# Patient Record
Sex: Female | Born: 1990
Health system: Southern US, Community
[De-identification: ages and names within clinical notes are randomized; demographics above are authoritative.]

## PROBLEM LIST (undated history)

## (undated) ENCOUNTER — Emergency Department (HOSPITAL_COMMUNITY): Discharge status: Absent without leave | Payer: Self-pay | Source: Home / Self Care

## (undated) ENCOUNTER — Inpatient Hospital Stay (HOSPITAL_COMMUNITY): Payer: Self-pay

## (undated) ENCOUNTER — Emergency Department (HOSPITAL_BASED_OUTPATIENT_CLINIC_OR_DEPARTMENT_OTHER): Admission: EM | Payer: Self-pay | Source: Home / Self Care

## (undated) DIAGNOSIS — Z789 Other specified health status: Secondary | ICD-10-CM

## (undated) DIAGNOSIS — R87629 Unspecified abnormal cytological findings in specimens from vagina: Secondary | ICD-10-CM

## (undated) DIAGNOSIS — K219 Gastro-esophageal reflux disease without esophagitis: Secondary | ICD-10-CM

## (undated) HISTORY — DX: Other specified health status: Z78.9

## (undated) HISTORY — PX: NO PAST SURGERIES: SHX2092

---
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
## 2011-03-19 LAB — POCT URINALYSIS DIP (DEVICE)
Glucose, UA: NEGATIVE
Ketones, ur: 15 — AB
Nitrite: NEGATIVE
Operator id: 239701
Protein, ur: 30 — AB
Specific Gravity, Urine: >=1.03
Urobilinogen, UA: 1
pH: 5.5

## 2011-03-19 LAB — POCT PREGNANCY, URINE
Operator id: 239701
Preg Test, Ur: NEGATIVE

## 2011-06-03 ENCOUNTER — Emergency Department (HOSPITAL_COMMUNITY)
Admission: EM | Admit: 2011-06-03 | Discharge: 2011-06-03 | Discharge status: Against Medical Advice | Payer: Self-pay | Attending: Emergency Medicine | Admitting: Emergency Medicine

## 2011-06-03 DIAGNOSIS — M549 Dorsalgia, unspecified: Secondary | ICD-10-CM | POA: Insufficient documentation

## 2011-06-03 NOTE — ED Notes (Signed)
Pt in from home with back pain denies recent injury states pain has been ongoing for several weeks states pain radiates to the left leg

## 2011-08-12 ENCOUNTER — Emergency Department (INDEPENDENT_AMBULATORY_CARE_PROVIDER_SITE_OTHER)
Admission: EM | Admit: 2011-08-12 | Discharge: 2011-08-12 | Disposition: A | Discharge status: Home / Self Care | Payer: Self-pay | Source: Home / Self Care | Attending: Emergency Medicine | Admitting: Emergency Medicine

## 2011-08-12 ENCOUNTER — Encounter (HOSPITAL_COMMUNITY): Payer: Self-pay

## 2011-08-12 DIAGNOSIS — M659 Synovitis and tenosynovitis, unspecified: Secondary | ICD-10-CM

## 2011-08-12 DIAGNOSIS — M549 Dorsalgia, unspecified: Secondary | ICD-10-CM

## 2011-08-12 DIAGNOSIS — N898 Other specified noninflammatory disorders of vagina: Secondary | ICD-10-CM

## 2011-08-12 LAB — WET PREP, GENITAL
Trich, Wet Prep: NONE SEEN
Yeast Wet Prep HPF POC: NONE SEEN

## 2011-08-12 LAB — POCT URINALYSIS DIP (DEVICE)
Bilirubin Urine: NEGATIVE
Glucose, UA: NEGATIVE mg/dL
Ketones, ur: 15 mg/dL — AB
Leukocytes, UA: NEGATIVE
Protein, ur: 30 mg/dL — AB
Specific Gravity, Urine: 1.02 (ref 1.005–1.030)
Urobilinogen, UA: 1 mg/dL (ref 0.0–1.0)
pH: 6.5 (ref 5.0–8.0)

## 2011-08-12 LAB — POCT PREGNANCY, URINE: Preg Test, Ur: NEGATIVE

## 2011-08-12 MED ORDER — METRONIDAZOLE 500 MG PO TABS: 500.0000 mg | ORAL_TABLET | Freq: Two times a day (BID) | ORAL | Status: AC

## 2011-08-12 MED ORDER — IBUPROFEN 800 MG PO TABS: 800.0000 mg | ORAL_TABLET | Freq: Three times a day (TID) | ORAL | Status: AC

## 2011-08-12 NOTE — ED Notes (Signed)
2 day hx of left ankle pain.  No swelling note in ankle.   1 week hx of foul odor vaginal discharge 1 week hx of back pain.   breast tenderness

## 2011-08-12 NOTE — ED Provider Notes (Signed)
History     CSN: [id number]  Arrival date & time 08/12/11  1820   First MD Initiated Contact with Patient 08/12/11 1838      No chief complaint on file.   (Consider location/radiation/quality/duration/timing/severity/associated sxs/prior treatment) HPI Comments: Patient presents urgent care tonight complaining of multiple symptoms including  Left ankle pain "I have not fallen or twisted or injured my ankle in any way" has been hurting since yesterday (points to the lateral aspect of lateral malleolus of ankle)  Patient also complains of an ongoing vaginal discharge that has an older to it it does not itch she has had bacterial vaginosis in the past and feels just like this was to be screened for sexually transmitted diseases as well.  Lower back pains that come and go have been having it for 2 days (points to lower lumbar area) no urinary symptoms no weakness no numbness no fevers.   Also described for the last 2 days left breast has been tender a bit sore no injury no trauma.  The history is provided by the patient.    No past medical history on file.  No past surgical history on file.  No family history on file.  History  Substance Use Topics  . Smoking status: Current Everyday Smoker  . Smokeless tobacco: Not on file  . Alcohol Use: Yes    OB History    Grav Para Term Preterm Abortions TAB SAB Ect Mult Living                  Review of Systems  Constitutional: Negative.  Negative for fever, chills, activity change and fatigue.  HENT: Negative for congestion.   Respiratory: Negative for shortness of breath.   Genitourinary: Positive for vaginal discharge and menstrual problem. Negative for genital sores.  Skin: Negative for rash and wound.    Allergies  Review of patient's allergies indicates no known allergies.  Home Medications  No current outpatient prescriptions on file.  BP 122/60  Pulse 60  Temp(Src) 97.7 F (36.5 C) (Oral)  Resp 12  SpO2  100%  Physical Exam  Nursing note and vitals reviewed. Constitutional: She appears well-developed and well-nourished. No distress.  HENT:  Head: Normocephalic.  Neck: Neck supple.  Abdominal: Soft. There is no tenderness.  Genitourinary: Uterus normal. Cervix exhibits discharge. Cervix exhibits no motion tenderness. Vaginal discharge found.  Musculoskeletal: Normal range of motion. She exhibits tenderness.       Right shoulder: She exhibits tenderness and pain. She exhibits normal range of motion, no swelling, no effusion, no deformity and no spasm.       Arms: Neurological: She is alert. No cranial nerve deficit. She exhibits normal muscle tone.  Skin: Skin is warm. No rash noted. No erythema.    ED Course  Procedures (including critical care time)  Labs Reviewed  POCT URINALYSIS DIP (DEVICE) - Abnormal; Notable for the following:    Ketones, ur 15 (*)    Protein, ur 30 (*)    All other components within normal limits  POCT PREGNANCY, URINE  GC/CHLAMYDIA PROBE AMP, GENITAL  WET PREP, GENITAL   No results found.   No diagnosis found.    MDM  Most symptomatic patient left ankle pain less than 24 hours nontraumatic. Vaginal discharge for one week. Lower back pains recurrent. Also concern about mild delay in her menstrual period sexually active screening for pregnancy.        Jimmie Molly, MD 08/12/11 [phone]

## 2011-08-12 NOTE — ED Notes (Signed)
Pt. Was discharged by mistake and given the wrong prescription.  Attempted to call pt. Number that was given tonight and this number was not inservice.  Called mother's telephone number listed in chart and gave mother information.  Explained to mother that pt. Would need to come back to urgent care to obtain correct prescriptions ordered by Dr. Ladon Applebaum.  Mother stated she would try to get in contact with pt.  Dr. Ladon Applebaum printed out pt. Prescriptions with discharge instructions.  Will place these documents on [place], RN phone nurse's desk.

## 2011-08-13 ENCOUNTER — Telehealth (HOSPITAL_COMMUNITY): Payer: Self-pay | Admitting: *Deleted

## 2011-08-13 LAB — GC/CHLAMYDIA PROBE AMP, GENITAL: Chlamydia, DNA Probe: NEGATIVE

## 2011-08-13 NOTE — ED Notes (Signed)
Mom contacted and asked her to tell pt. To come back and pick up her d/c instructions and 2 Rx.'s . She said she would tell her. Vassie Moselle 08/13/2011

## 2011-09-09 ENCOUNTER — Encounter (HOSPITAL_COMMUNITY): Payer: Self-pay | Admitting: Emergency Medicine

## 2011-09-09 ENCOUNTER — Emergency Department (HOSPITAL_COMMUNITY)
Admission: EM | Admit: 2011-09-09 | Discharge: 2011-09-09 | Disposition: A | Discharge status: Home / Self Care | Payer: Medicaid Other | Attending: Emergency Medicine | Admitting: Emergency Medicine

## 2011-09-09 DIAGNOSIS — N730 Acute parametritis and pelvic cellulitis: Secondary | ICD-10-CM

## 2011-09-09 DIAGNOSIS — N739 Female pelvic inflammatory disease, unspecified: Secondary | ICD-10-CM | POA: Insufficient documentation

## 2011-09-09 LAB — URINALYSIS, ROUTINE W REFLEX MICROSCOPIC
Bilirubin Urine: NEGATIVE
Glucose, UA: NEGATIVE mg/dL
Hgb urine dipstick: NEGATIVE
Ketones, ur: NEGATIVE mg/dL
Leukocytes, UA: NEGATIVE
Nitrite: NEGATIVE
Specific Gravity, Urine: 1.026 (ref 1.005–1.030)
Urobilinogen, UA: 1 mg/dL (ref 0.0–1.0)
pH: 5.5 (ref 5.0–8.0)

## 2011-09-09 LAB — WET PREP, GENITAL
Trich, Wet Prep: NONE SEEN
Yeast Wet Prep HPF POC: NONE SEEN

## 2011-09-09 LAB — POCT PREGNANCY, URINE: Preg Test, Ur: NEGATIVE

## 2011-09-09 MED ORDER — ONDANSETRON 4 MG PO TBDP
8.0000 mg | ORAL_TABLET | Freq: Once | ORAL | Status: AC
  Administered 2011-09-09: 8 mg via ORAL
  Filled 2011-09-09: qty 2

## 2011-09-09 MED ORDER — SODIUM CHLORIDE 0.9 % IV SOLN: 20.0000 mL | INTRAVENOUS | Status: DC

## 2011-09-09 MED ORDER — CEFTRIAXONE SODIUM 250 MG IJ SOLR
250.0000 mg | INTRAMUSCULAR | Status: DC
  Administered 2011-09-09: 250 mg via INTRAMUSCULAR
  Filled 2011-09-09: qty 250

## 2011-09-09 MED ORDER — AZITHROMYCIN 250 MG PO TABS
1000.0000 mg | ORAL_TABLET | Freq: Once | ORAL | Status: AC
  Administered 2011-09-09: 1000 mg via ORAL
  Filled 2011-09-09: qty 4

## 2011-09-09 MED ORDER — ACETAMINOPHEN 325 MG PO TABS
650.0000 mg | ORAL_TABLET | Freq: Once | ORAL | Status: AC
  Administered 2011-09-09: 650 mg via ORAL
  Filled 2011-09-09: qty 2

## 2011-09-09 MED ORDER — ONDANSETRON HCL 4 MG PO TABS: 4.0000 mg | ORAL_TABLET | Freq: Four times a day (QID) | ORAL | Status: AC

## 2011-09-09 MED ORDER — ASPIRIN 81 MG PO CHEW: 324.0000 mg | CHEWABLE_TABLET | Freq: Once | ORAL | Status: DC

## 2011-09-09 MED ORDER — LIDOCAINE HCL (PF) 1 % IJ SOLN
INTRAMUSCULAR | Status: AC
  Administered 2011-09-09: 5 mL
  Filled 2011-09-09: qty 5

## 2011-09-09 MED ORDER — DOXYCYCLINE HYCLATE 100 MG PO CAPS: 100.0000 mg | ORAL_CAPSULE | Freq: Two times a day (BID) | ORAL | Status: AC

## 2011-09-09 NOTE — Discharge Instructions (Signed)
Angela Lynch you are not pregnant today. You did have some discharge on exam today. You have pelvic inflammatory disease or PID We gave you to antibiotics in the ER. Continue your Flagyl at home and we will add one more antibiotic to treat this infection. Followup at the health Department at the free STD clinic next week. Do not have any intercourse until you are rechecked. If you have nausea vomiting high fever return to the ER. There is an ER at [facility] that would be more appropriate for this condition. Get a PCP from the list below. Tell your partners to go to the free STD clinic as well to be checked and treated. Take ibuprofen for pain and zofran for the nausea.   RESOURCE GUIDE  Dental Problems  Patients with Medicaid: [facility] [phone]                                           [phone] [address] Phone:  [phone]                                                  Phone:  [phone]  If unable to pay or uninsured, contact:  [facility] or [facility]. to become qualified for the adult dental clinic.  Chronic Pain Problems Contact [facility]  [phone] Patients need to be referred by their primary care doctor.  Insufficient Money for Medicine Contact [employer] Way:  call "211" or [facility] [phone].  No Primary Care Doctor Call [facility]  [phone] Other agencies that provide inexpensive medical care    [facility]  [phone]    [facility]  [phone]    [facility]  [phone]    [facility]  [phone]    [facility]  [phone]    [facility]  [phone]  Psychological Services [facility]  [phone] [facility] Services  [phone] [facility] Mental Health   [phone] (emergency services [phone])  Substance Abuse Resources [facility]  [phone] [facility] [phone] [facility] [place] [phone] Floydene Flock 5083646096 Residential & Outpatient Substance Abuse Program  [phone]  Abuse/Neglect [facility] Child Abuse Hotline 618-285-0696 [facility] Child Abuse Hotline 832-167-7883 (After Hours)  Emergency Shelter [facility] (818)[phone]  Maternity Homes Room at the [place] [facility] [place] 707-197-7985 [facility] [phone]  MRSA Hotline #:   772-008-7608    [facility] Resources  [facility] [place]     [employer]                          [facility] Dept. [address] [place]                       [address]      [address]  [address]  Cristobal Goldmann Phone:  [phone]                                   Phone:  [phone]                 Phone:  [phone]  [facility] Phone:  [phone]  [facility] Child Abuse Hotline 479-384-2662 260-768-3012 (After Hours) Pelvic Inflammatory Disease Pelvic inflammatory disease (PID) is an infection of some, or all, of the female pelvic organs. PID is caused by germs. HOME CARE  Take your medicine as told. Finish them even if you start to feel better.   Only take medicine as told by your doctor.   Do not have sex (intercourse) until the infection is gone.   Tell your sex partner you have PID. Treatment may be needed.   Keep your follow-up appointments.  GET HELP RIGHT AWAY IF:   You have a fever.   You have an increase in belly (abdominal) pain.   You start to get the chills.   You have pain when you pee (urinate).   You are not better after 72 hours.   Your sex partner tells you he or she has an STD.   You throw up (vomit).   You cannot take your medicines.  MAKE SURE YOU:   Understand these instructions.   Will watch your condition.   Will get help right  away if you are not doing well or get worse.  Document Released: 09/07/2008 Document Revised: 05/31/2011 Document Reviewed: 10/11/2009 [facility] Patient Information 2012 [place], [place].  Pelvic Inflammatory Disease Pelvic inflammatory disease (PID) is an infection of some, or all, of the female pelvic organs. PID is caused by germs. HOME CARE  Take your medicine as told. Finish them even if you start to feel better.   Only take medicine as told by your doctor.   Do not have sex (intercourse) until the infection is gone.   Tell your sex partner you have PID. Treatment may be needed.   Keep your follow-up appointments.  GET HELP RIGHT AWAY IF:   You have a fever.   You have an increase in belly (abdominal) pain.   You start to get the chills.   You have pain when you pee (urinate).   You are not better after 72 hours.   Your sex partner tells you he or she has an STD.   You throw up (vomit).   You cannot take your medicines.  MAKE SURE YOU:   Understand these instructions.   Will watch your condition.   Will get help right away if you are not doing well or get worse.  Document Released: 09/07/2008 Document Revised: 05/31/2011 Document Reviewed: 10/11/2009 [facility] Patient Information 2012 [place],  [place].

## 2011-09-09 NOTE — ED Provider Notes (Signed)
History     CSN: [id number]  Arrival date & time 09/09/11  6213   First MD Initiated Contact with Patient 09/09/11 0636      Chief Complaint  Patient presents with  . Morning Sickness  . Nausea  . Emesis    (Consider location/radiation/quality/duration/timing/severity/associated sxs/prior treatment) Patient is a 21 y.o. female presenting with vomiting. The history is provided by the patient. No language interpreter was used.  Emesis  This is a new problem. Associated symptoms include abdominal pain. Pertinent negatives include no arthralgias, no chills, no diarrhea, no fever, no headaches, no myalgias, no sweats and no URI.   patient is here today wanting to know if she is pregnant. Also having diffuse abdominal pain and vomiting as x2 this morning. States that she's had vaginal discharge 3 days. States that she went to urgent care on February 17 recieved a prescription for Flagyl for BV.  States that she didn't get the prescription filled until last week. Last menstrual period was February 14. States she has 2 partners she is not on birth control. She does not use condoms. States that she is nauseated presently. Also having some lower back pain but has not taken anything for pain.  History reviewed. No pertinent past medical history.  History reviewed. No pertinent past surgical history.  History reviewed. No pertinent family history.  History  Substance Use Topics  . Smoking status: Current Everyday Smoker  . Smokeless tobacco: Not on file  . Alcohol Use: Yes    OB History    Grav Para Term Preterm Abortions TAB SAB Ect Mult Living                  Review of Systems  Constitutional: Negative for fever and chills.  Gastrointestinal: Positive for vomiting and abdominal pain. Negative for diarrhea.  Genitourinary: Positive for vaginal discharge. Negative for dysuria, urgency, frequency, hematuria, flank pain, vaginal bleeding, difficulty urinating, menstrual problem, pelvic  pain and dyspareunia.  Musculoskeletal: Positive for back pain. Negative for myalgias, arthralgias and gait problem.  Neurological: Negative for dizziness and headaches.    Allergies  Review of patient's allergies indicates no known allergies.  Home Medications   Current Outpatient Rx  Name Route Sig Dispense Refill  . IBUPROFEN 200 MG PO TABS Oral Take 200 mg by mouth every 6 (six) hours as needed. For pain    . METRONIDAZOLE 500 MG PO TABS Oral Take 500 mg by mouth 2 (two) times daily. Start date:09-06-11      BP 127/72  Pulse 93  Temp(Src) 98.2 F (36.8 C) (Oral)  Resp 18  SpO2 97%  LMP 06/26/2011  Physical Exam  Nursing note and vitals reviewed. Constitutional: She is oriented to person, place, and time. She appears well-developed and well-nourished.  HENT:  Head: Normocephalic and atraumatic.  Eyes: Conjunctivae and EOM are normal. Pupils are equal, round, and reactive to light.  Neck: Normal range of motion. Neck supple.  Cardiovascular: Normal rate and regular rhythm.   Pulmonary/Chest: Effort normal and breath sounds normal.  Abdominal: Soft. Bowel sounds are normal.  Musculoskeletal: Normal range of motion. She exhibits no edema and no tenderness.  Neurological: She is alert and oriented to person, place, and time. She has normal reflexes.  Skin: Skin is warm and dry.  Psychiatric: She has a normal mood and affect.    ED Course  Procedures (including critical care time)   Labs Reviewed  URINALYSIS, ROUTINE W REFLEX MICROSCOPIC  POCT PREGNANCY, URINE  GC/CHLAMYDIA PROBE AMP, GENITAL  WET PREP, GENITAL   No results found.   No diagnosis found.    MDM  PID treated with rocephen and azithromycin in the ER. Negative preg test.  Continue flagyl and get rx for doxycycline filled. Follow up at std clinic.  No intercourse until rechecked and partners checked.   Labs Reviewed  WET PREP, GENITAL - Abnormal; Notable for the following:    Clue Cells Wet Prep  HPF POC FEW (*)    WBC, Wet Prep HPF POC MODERATE (*)    All other components within normal limits  URINALYSIS, ROUTINE W REFLEX MICROSCOPIC  POCT PREGNANCY, URINE  GC/CHLAMYDIA PROBE AMP, GENITAL          Jethro Bastos, NP 09/10/11 1015

## 2011-09-09 NOTE — ED Notes (Signed)
Pt reports morning sickness uncertain if preg LMP 1 month ago late 1 week

## 2011-09-09 NOTE — ED Notes (Addendum)
Patient complaining of episodes of nausea, lower right and left quadrant abdominal pain, and lower back pain for the past two to three days.  Describes abdominal pain as a "sick/nauseous" feeling; rates feeling (i.e. Pain) as 10/10 on the numerical pain scale.  Denies diarrhea, chest pain, shortness of breath, and blurred vision.  Patient does report urinary frequency; denies other urinary symptoms.  Last bowel movement today; normal.  Patient reports abnormal vaginal discharge yesterday (thick and white in consistency).  Last period 14th of February; patient states that there is a chance that she could be pregnant.  Patient alert and oriented x4; PERRL present.  Upon arrival to room, patient changed into gown and assisted to restroom to provide urine sample.  Family at bedside; will continue to monitor.

## 2011-09-09 NOTE — ED Notes (Signed)
Verified with Dr. Lynelle Doctor that orders placed were intended for another patient.

## 2011-09-10 LAB — GC/CHLAMYDIA PROBE AMP, GENITAL
Chlamydia, DNA Probe: NEGATIVE
GC Probe Amp, Genital: NEGATIVE

## 2011-09-13 NOTE — ED Provider Notes (Signed)
Medical screening examination/treatment/procedure(s) were performed by non-physician practitioner and as supervising physician I was immediately available for consultation/collaboration.  Anaiyah Anglemyer R Zacharias Ridling, MD 09/13/11 0243 

## 2011-11-01 ENCOUNTER — Emergency Department (INDEPENDENT_AMBULATORY_CARE_PROVIDER_SITE_OTHER)
Admission: EM | Admit: 2011-11-01 | Discharge: 2011-11-01 | Disposition: A | Discharge status: Home / Self Care | Source: Home / Self Care | Attending: Family Medicine | Admitting: Family Medicine

## 2011-11-01 ENCOUNTER — Encounter (HOSPITAL_COMMUNITY): Payer: Self-pay | Admitting: *Deleted

## 2011-11-01 DIAGNOSIS — J309 Allergic rhinitis, unspecified: Secondary | ICD-10-CM

## 2011-11-01 DIAGNOSIS — J302 Other seasonal allergic rhinitis: Secondary | ICD-10-CM

## 2011-11-01 LAB — POCT PREGNANCY, URINE: Preg Test, Ur: NEGATIVE

## 2011-11-01 LAB — POCT URINALYSIS DIP (DEVICE)
Ketones, ur: NEGATIVE mg/dL
Leukocytes, UA: NEGATIVE
Nitrite: NEGATIVE
Protein, ur: NEGATIVE mg/dL
Urobilinogen, UA: 1 mg/dL (ref 0.0–1.0)
pH: 6.5 (ref 5.0–8.0)

## 2011-11-01 LAB — POCT I-STAT, CHEM 8
BUN: 12 mg/dL (ref 6–23)
Calcium, Ion: 1.21 mmol/L (ref 1.12–1.32)
HCT: 42 % (ref 36.0–46.0)
Hemoglobin: 14.3 g/dL (ref 12.0–15.0)
Sodium: 140 mEq/L (ref 135–145)
TCO2: 25 mmol/L (ref 0–100)

## 2011-11-01 MED ORDER — CETIRIZINE HCL 10 MG PO TABS: 10.0000 mg | ORAL_TABLET | Freq: Every day | ORAL | Status: DC

## 2011-11-01 MED ORDER — FLUTICASONE PROPIONATE 50 MCG/ACT NA SUSP: 1.0000 | Freq: Two times a day (BID) | NASAL | Status: DC

## 2011-11-01 NOTE — ED Provider Notes (Signed)
History     CSN: [id number]  Arrival date & time 11/01/11  1641   First MD Initiated Contact with Patient 11/01/11 1653      Chief Complaint  Patient presents with  . Generalized Body Aches    (Consider location/radiation/quality/duration/timing/severity/associated sxs/prior treatment) Patient is a 21 y.o. female presenting with headaches. The history is provided by the patient and a parent.  Headache The primary symptoms include headaches and dizziness. Primary symptoms comment: never ending list of complaints , vague with years hx, nonspecific. The symptoms began more than 1 week ago.  The headache is associated with weakness.  Dizziness also occurs with weakness.  Additional symptoms include weakness.    History reviewed. No pertinent past medical history.  History reviewed. No pertinent past surgical history.  No family history on file.  History  Substance Use Topics  . Smoking status: Current Everyday Smoker  . Smokeless tobacco: Not on file  . Alcohol Use: Yes    OB History    Grav Para Term Preterm Abortions TAB SAB Ect Mult Living                  Review of Systems  Constitutional: Positive for fatigue.  HENT: Positive for congestion and rhinorrhea.   Gastrointestinal: Negative.   Musculoskeletal: Positive for back pain.  Neurological: Positive for dizziness, syncope, weakness, light-headedness and headaches.    Allergies  Review of patient's allergies indicates no known allergies.  Home Medications   Current Outpatient Rx  Name Route Sig Dispense Refill  . CETIRIZINE HCL 10 MG PO TABS Oral Take 1 tablet (10 mg total) by mouth daily. One tab daily for allergies 30 tablet 1  . FLUTICASONE PROPIONATE 50 MCG/ACT NA SUSP Nasal Place 1 spray into the nose 2 (two) times daily. 1 g 2  . IBUPROFEN 200 MG PO TABS Oral Take 200 mg by mouth every 6 (six) hours as needed. For pain    . METRONIDAZOLE 500 MG PO TABS Oral Take 500 mg by mouth 2 (two) times daily.  Start date:09-06-11      BP 139/96  Pulse 63  Temp(Src) 98.1 F (36.7 C) (Oral)  Resp 18  SpO2 96%  LMP 10/23/2011  Physical Exam  Nursing note and vitals reviewed. Constitutional: She is oriented to person, place, and time. She appears well-developed and well-nourished.  HENT:  Head: Normocephalic.  Right Ear: External ear normal.  Left Ear: External ear normal.  Nose: Nose normal.  Mouth/Throat: Oropharynx is clear and moist.  Eyes: Conjunctivae and EOM are normal. Pupils are equal, round, and reactive to light.  Neck: Normal range of motion. Neck supple. No thyromegaly present.  Cardiovascular: Normal rate, regular rhythm, normal heart sounds and intact distal pulses.   Pulmonary/Chest: Breath sounds normal.  Lymphadenopathy:    She has no cervical adenopathy.  Neurological: She is alert and oriented to person, place, and time. Coordination normal.  Skin: Skin is warm and dry.    ED Course  Procedures (including critical care time)   Labs Reviewed  POCT URINALYSIS DIP (DEVICE)  POCT I-STAT, CHEM 8  POCT PREGNANCY, URINE   No results found.   1. Seasonal allergies       MDM  All labs nl        Linna Hoff, MD 11/01/11 [phone]

## 2011-11-01 NOTE — ED Notes (Signed)
Pt  Has  Multiple   Symptoms  To  Include       Chronic  Back  Pain  For  Years    Worse  Over  Last  Several   Months        Also    Voiced  Is  Symptoms  Of     Headaches  And  r  Hip  Pain as  Well  Pt  denys  Any  specefic  Injury      She  Is  Sitting  Upright on   Exam table      Speaking in rapid  Complete  sentances    Appears  In no  Distress

## 2012-04-05 ENCOUNTER — Emergency Department (HOSPITAL_COMMUNITY)
Admission: EM | Admit: 2012-04-05 | Discharge: 2012-04-06 | Disposition: A | Discharge status: Home / Self Care | Attending: Emergency Medicine | Admitting: Emergency Medicine

## 2012-04-05 ENCOUNTER — Encounter (HOSPITAL_COMMUNITY): Payer: Self-pay | Admitting: *Deleted

## 2012-04-05 DIAGNOSIS — N390 Urinary tract infection, site not specified: Secondary | ICD-10-CM | POA: Insufficient documentation

## 2012-04-05 DIAGNOSIS — F17200 Nicotine dependence, unspecified, uncomplicated: Secondary | ICD-10-CM | POA: Insufficient documentation

## 2012-04-05 LAB — URINALYSIS, ROUTINE W REFLEX MICROSCOPIC
Bilirubin Urine: NEGATIVE
Nitrite: NEGATIVE
Specific Gravity, Urine: 1.028 (ref 1.005–1.030)
pH: 7.5 (ref 5.0–8.0)

## 2012-04-05 LAB — PREGNANCY, URINE: Preg Test, Ur: NEGATIVE

## 2012-04-05 LAB — URINE MICROSCOPIC-ADD ON

## 2012-04-05 NOTE — ED Provider Notes (Signed)
History     CSN: [id number]  Arrival date & time 04/05/12  2035   First MD Initiated Contact with Patient 04/05/12 2330      Chief Complaint  Patient presents with  . Abdominal Pain    (Consider location/radiation/quality/duration/timing/severity/associated sxs/prior treatment) HPI Comments: Patient reports urinary frequency, urgency, hematuria, and dysuria x 3 days.  LMP was at the end of August.  Has had nausea and abnormal cravings.  Denies fevers, abdominal pain, change in bowel habits, vomiting, abnormal vaginal discharge or bleeding.  Pt has not taken a home pregnancy test.   Patient is a 21 y.o. female presenting with abdominal pain. The history is provided by the patient and a parent.  Abdominal Pain The primary symptoms of the illness include nausea and dysuria. The primary symptoms of the illness do not include abdominal pain, fever, shortness of breath, vomiting, diarrhea, vaginal discharge or vaginal bleeding.  The dysuria is associated with frequency and urgency.  Additional symptoms associated with the illness include urgency and frequency. Symptoms associated with the illness do not include chills or constipation.    History reviewed. No pertinent past medical history.  History reviewed. No pertinent past surgical history.  No family history on file.  History  Substance Use Topics  . Smoking status: Current Every Day Smoker  . Smokeless tobacco: Not on file  . Alcohol Use: Yes    OB History    Grav Para Term Preterm Abortions TAB SAB Ect Mult Living                  Review of Systems  Constitutional: Negative for fever and chills.  Respiratory: Negative for shortness of breath.   Cardiovascular: Negative for chest pain.  Gastrointestinal: Positive for nausea. Negative for vomiting, abdominal pain, diarrhea, constipation and blood in stool.  Genitourinary: Positive for dysuria, urgency, frequency and menstrual problem. Negative for vaginal bleeding and  vaginal discharge.    Allergies  Review of patient's allergies indicates no known allergies.  Home Medications  No current outpatient prescriptions on file.  BP 132/57  Pulse 66  Temp 98.8 F (37.1 C) (Oral)  Resp 16  SpO2 99%  LMP 02/04/2012  Physical Exam  Nursing note and vitals reviewed. Constitutional: She appears well-developed and well-nourished. No distress.  HENT:  Head: Normocephalic and atraumatic.  Neck: Neck supple.  Cardiovascular: Normal rate and regular rhythm.   Pulmonary/Chest: Effort normal and breath sounds normal. No respiratory distress. She has no wheezes. She has no rales.  Abdominal: Soft. She exhibits no distension and no mass. There is no tenderness. There is no rebound, no guarding and no CVA tenderness.  Neurological: She is alert.  Skin: She is not diaphoretic.    ED Course  Procedures (including critical care time)  Labs Reviewed  URINALYSIS, ROUTINE W REFLEX MICROSCOPIC - Abnormal; Notable for the following:    APPearance CLOUDY (*)     Hgb urine dipstick LARGE (*)     Protein, ur 100 (*)     Leukocytes, UA LARGE (*)     All other components within normal limits  URINE MICROSCOPIC-ADD ON - Abnormal; Notable for the following:    Squamous Epithelial / LPF FEW (*)     Bacteria, UA MANY (*)     All other components within normal limits  PREGNANCY, URINE   No results found.   1. UTI (lower urinary tract infection)     MDM  Pt with three days of urinary symptoms, found to  have UTI.  LMP end of August.  Urine pregnancy is negative.  Pt advised to take home pregnancy test in another week if she still has not had a period and to follow up with gyn.  Mother states she will make an appointment for her with the gyn.  Discussed all results with patient.  Pt given return precautions.  Pt verbalizes understanding and agrees with plan.           [place], [place] 04/06/12 [phone]

## 2012-04-05 NOTE — ED Notes (Signed)
The pt thinks she has a uti and she want  To check and see if she is preg.  She has urinary frequency and blood in her urine.  lmp  aug

## 2012-04-06 MED ORDER — NITROFURANTOIN MONOHYD MACRO 100 MG PO CAPS: 100.0000 mg | ORAL_CAPSULE | Freq: Two times a day (BID) | ORAL | Status: DC

## 2012-04-06 MED ORDER — PHENAZOPYRIDINE HCL 200 MG PO TABS: 200.0000 mg | ORAL_TABLET | Freq: Three times a day (TID) | ORAL | Status: DC

## 2012-04-06 NOTE — ED Provider Notes (Signed)
Medical screening examination/treatment/procedure(s) were performed by non-physician practitioner and as supervising physician I was immediately available for consultation/collaboration.  Cahterine Heinzel M Vivika Poythress, MD 04/06/12 0542 

## 2013-01-27 ENCOUNTER — Emergency Department (HOSPITAL_BASED_OUTPATIENT_CLINIC_OR_DEPARTMENT_OTHER)
Admission: EM | Admit: 2013-01-27 | Discharge: 2013-01-27 | Disposition: A | Discharge status: Home / Self Care | Payer: Self-pay | Attending: Emergency Medicine | Admitting: Emergency Medicine

## 2013-01-27 ENCOUNTER — Encounter (HOSPITAL_BASED_OUTPATIENT_CLINIC_OR_DEPARTMENT_OTHER): Payer: Self-pay | Admitting: *Deleted

## 2013-01-27 DIAGNOSIS — Z202 Contact with and (suspected) exposure to infections with a predominantly sexual mode of transmission: Secondary | ICD-10-CM | POA: Insufficient documentation

## 2013-01-27 DIAGNOSIS — N760 Acute vaginitis: Secondary | ICD-10-CM | POA: Insufficient documentation

## 2013-01-27 DIAGNOSIS — Z3202 Encounter for pregnancy test, result negative: Secondary | ICD-10-CM | POA: Insufficient documentation

## 2013-01-27 DIAGNOSIS — Z87891 Personal history of nicotine dependence: Secondary | ICD-10-CM | POA: Insufficient documentation

## 2013-01-27 DIAGNOSIS — A749 Chlamydial infection, unspecified: Secondary | ICD-10-CM | POA: Insufficient documentation

## 2013-01-27 DIAGNOSIS — B9689 Other specified bacterial agents as the cause of diseases classified elsewhere: Secondary | ICD-10-CM | POA: Insufficient documentation

## 2013-01-27 DIAGNOSIS — A499 Bacterial infection, unspecified: Secondary | ICD-10-CM | POA: Insufficient documentation

## 2013-01-27 LAB — URINALYSIS, ROUTINE W REFLEX MICROSCOPIC
Glucose, UA: NEGATIVE mg/dL
Hgb urine dipstick: NEGATIVE
Specific Gravity, Urine: 1.029 (ref 1.005–1.030)

## 2013-01-27 LAB — WET PREP, GENITAL
Trich, Wet Prep: NONE SEEN
Yeast Wet Prep HPF POC: NONE SEEN

## 2013-01-27 LAB — PREGNANCY, URINE: Preg Test, Ur: NEGATIVE

## 2013-01-27 MED ORDER — CEFTRIAXONE SODIUM 250 MG IJ SOLR
250.0000 mg | Freq: Once | INTRAMUSCULAR | Status: AC
  Administered 2013-01-27: 250 mg via INTRAMUSCULAR
  Filled 2013-01-27: qty 250

## 2013-01-27 MED ORDER — LIDOCAINE HCL (PF) 1 % IJ SOLN
INTRAMUSCULAR | Status: AC
  Administered 2013-01-27: 5 mL
  Filled 2013-01-27: qty 5

## 2013-01-27 MED ORDER — AZITHROMYCIN 250 MG PO TABS
1000.0000 mg | ORAL_TABLET | Freq: Once | ORAL | Status: AC
  Administered 2013-01-27: 1000 mg via ORAL
  Filled 2013-01-27: qty 4

## 2013-01-27 MED ORDER — METRONIDAZOLE 500 MG PO TABS: 500.0000 mg | ORAL_TABLET | Freq: Two times a day (BID) | ORAL | Status: DC

## 2013-01-27 NOTE — ED Provider Notes (Signed)
  CSN: [id number]     Arrival date & time 01/27/13  1712 History     First MD Initiated Contact with Patient 01/27/13 1724     Chief Complaint  Patient presents with  . Vaginal Discharge   (Consider location/radiation/quality/duration/timing/severity/associated sxs/prior Treatment) HPI Comments: Pt states that her partner noticed a discharge yesterday and so they both came in to be evaluated today:pt states that she is not having any symptoms:pt states that she had chlamydia in January and was treated at that time and has not had symptoms since:pt is not using any birth control  The history is provided by the patient. No language interpreter was used.    History reviewed. No pertinent past medical history. History reviewed. No pertinent past surgical history. History reviewed. No pertinent family history. History  Substance Use Topics  . Smoking status: Former Games developer  . Smokeless tobacco: Not on file  . Alcohol Use: No   OB History   Grav Para Term Preterm Abortions TAB SAB Ect Mult Living                 Review of Systems  Constitutional: Negative.   Respiratory: Negative.   Cardiovascular: Negative.     Allergies  Review of patient's allergies indicates no known allergies.  Home Medications  No current outpatient prescriptions on file. BP 136/58  Pulse 70  Temp(Src) 98 F (36.7 C) (Oral)  Resp 16  Ht 5\' 3"  (1.6 m)  Wt 195 lb (88.451 kg)  BMI 34.55 kg/m2  SpO2 100%  LMP 12/16/2012 Physical Exam  Nursing note and vitals reviewed. Constitutional: She is oriented to person, place, and time. She appears well-developed and well-nourished.  HENT:  Head: Normocephalic and atraumatic.  Cardiovascular: Normal rate and regular rhythm.   Pulmonary/Chest: Effort normal and breath sounds normal.  Abdominal: Soft. Bowel sounds are normal. There is no tenderness.  Genitourinary:  Thick malodorous white discharge without cmt  Musculoskeletal: Normal range of motion.   Neurological: She is alert and oriented to person, place, and time.  Skin: Skin is warm and dry.    ED Course   Procedures (including critical care time)  Labs Reviewed  WET PREP, GENITAL - Abnormal; Notable for the following:    Clue Cells Wet Prep HPF POC MANY (*)    WBC, Wet Prep HPF POC MODERATE (*)    All other components within normal limits  URINALYSIS, ROUTINE W REFLEX MICROSCOPIC - Abnormal; Notable for the following:    Ketones, ur 15 (*)    All other components within normal limits  GC/CHLAMYDIA PROBE AMP  PREGNANCY, URINE   No results found. 1. Potential exposure to STD   2. BV (bacterial vaginosis)     MDM  Pt treated for std as partner has urethritis and pt having discharge:pt also treated for bv:pt is not pregnant  Teressa Lower, NP 01/27/13 1816

## 2013-01-27 NOTE — ED Notes (Signed)
Pt c/o vaginal discharge x 1 day, partner also being seen

## 2013-01-27 NOTE — ED Provider Notes (Signed)
Medical screening examination/treatment/procedure(s) were performed by non-physician practitioner and as supervising physician I was immediately available for consultation/collaboration.  Lyanne Co, MD 01/27/13 [phone]

## 2013-01-28 ENCOUNTER — Encounter (HOSPITAL_COMMUNITY): Payer: Self-pay | Admitting: *Deleted

## 2013-01-28 LAB — GC/CHLAMYDIA PROBE AMP: CT Probe RNA: NEGATIVE

## 2013-01-29 ENCOUNTER — Telehealth (HOSPITAL_COMMUNITY): Payer: Self-pay | Admitting: Emergency Medicine

## 2013-01-29 NOTE — ED Notes (Signed)
+  Gonorrhea. Patient treated with Rocephin and Zithromax. [facility] faxed. 

## 2013-01-29 NOTE — ED Notes (Signed)
Patient has +Gonorrhea. °

## 2013-02-01 ENCOUNTER — Telehealth (HOSPITAL_COMMUNITY): Payer: Self-pay | Admitting: Emergency Medicine

## 2013-04-03 ENCOUNTER — Ambulatory Visit: Payer: Self-pay | Admitting: Obstetrics & Gynecology

## 2013-06-25 NOTE — L&D Delivery Note (Signed)
Delivery Note At 10:32 AM a viable female was delivered via Vaginal, Spontaneous Delivery (Presentation: Left Occiput Posterior).  APGAR: 7, ; weight 7 lb 13.2 oz (3550 g).   Placenta status: , .  Cord: 3 vessels with the following complications: None.  Cord pH: n/a  Anesthesia: Epidural  Episiotomy: None Lacerations: None Suture Repair: n/a Est. Blood Loss (mL): 400mL  Mom to postpartum.  Baby to Couplet care / Skin to Skin.  Jacquiline Doearker, Caleb 04/20/2014, 11:15 AM  I was gloved and present for delivery in its entirety.  Prolonged second stage of labor progressed secondary to OP presentation initially.    Complications: significant labial swelling  Lacerations: none   EBL: 400mL  Ami Thornsberry ROCIO, MD 12:23 AM

## 2013-09-07 LAB — HEMOGLOBIN A1C: Hgb A1c MFr Bld: 5.7 % (ref 4.0–6.0)

## 2013-10-29 ENCOUNTER — Encounter (HOSPITAL_COMMUNITY): Payer: Self-pay | Admitting: *Deleted

## 2013-10-29 ENCOUNTER — Inpatient Hospital Stay (HOSPITAL_COMMUNITY)
Admission: AD | Admit: 2013-10-29 | Discharge: 2013-10-29 | Disposition: A | Discharge status: Home / Self Care | Payer: Medicaid Other | Source: Ambulatory Visit | Attending: Obstetrics and Gynecology | Admitting: Obstetrics and Gynecology

## 2013-10-29 DIAGNOSIS — O9989 Other specified diseases and conditions complicating pregnancy, childbirth and the puerperium: Principal | ICD-10-CM

## 2013-10-29 DIAGNOSIS — Z87891 Personal history of nicotine dependence: Secondary | ICD-10-CM | POA: Insufficient documentation

## 2013-10-29 DIAGNOSIS — O99891 Other specified diseases and conditions complicating pregnancy: Secondary | ICD-10-CM | POA: Insufficient documentation

## 2013-10-29 DIAGNOSIS — N912 Amenorrhea, unspecified: Secondary | ICD-10-CM

## 2013-10-29 LAB — URINALYSIS, ROUTINE W REFLEX MICROSCOPIC
Bilirubin Urine: NEGATIVE
Glucose, UA: NEGATIVE mg/dL
Hgb urine dipstick: NEGATIVE
Ketones, ur: NEGATIVE mg/dL
LEUKOCYTES UA: NEGATIVE
NITRITE: NEGATIVE
PROTEIN: NEGATIVE mg/dL
Specific Gravity, Urine: 1.015 (ref 1.005–1.030)
UROBILINOGEN UA: 2 mg/dL — AB (ref 0.0–1.0)
pH: 7 (ref 5.0–8.0)

## 2013-10-29 LAB — POCT PREGNANCY, URINE: PREG TEST UR: POSITIVE — AB

## 2013-10-29 NOTE — Discharge Instructions (Signed)
Pregnancy - First Trimester  During sexual intercourse, millions of sperm go into the vagina. Only 1 sperm will penetrate and fertilize the female egg while it is in the Fallopian tube. One week later, the fertilized egg implants into the wall of the uterus. An embryo begins to develop into a baby. At 6 to 8 weeks, the eyes and face are formed and the heartbeat can be seen on ultrasound. At the end of 12 weeks (first trimester), all the baby's organs are formed. Now that you are pregnant, you will want to do everything you can to have a healthy baby. Two of the most important things are to get good prenatal care and follow your caregiver's instructions. Prenatal care is all the medical care you receive before the baby's birth. It is given to prevent, find, and treat problems during the pregnancy and childbirth.  PRENATAL EXAMS  · During prenatal visits, your weight, blood pressure, and urine are checked. This is done to make sure you are healthy and progressing normally during the pregnancy.  · A pregnant woman should gain 25 to 35 pounds during the pregnancy. However, if you are overweight or underweight, your caregiver will advise you regarding your weight.  · Your caregiver will ask and answer questions for you.  · Blood work, cervical cultures, other necessary tests, and a Pap test are done during your prenatal exams. These tests are done to check on your health and the probable health of your baby. Tests are strongly recommended and done for HIV with your permission. This is the virus that causes AIDS. These tests are done because medicines can be given to help prevent your baby from being born with this infection should you have been infected without knowing it. Blood work is also used to find out your blood type, previous infections, and follow your blood levels (hemoglobin).  · Low hemoglobin (anemia) is common during pregnancy. Iron and vitamins are given to help prevent this. Later in the pregnancy, blood  tests for diabetes will be done along with any other tests if any problems develop.  · You may need other tests to make sure you and the baby are doing well.  CHANGES DURING THE FIRST TRIMESTER   Your body goes through many changes during pregnancy. They vary from person to person. Talk to your caregiver about changes you notice and are concerned about. Changes can include:  · Your menstrual period stops.  · The egg and sperm carry the genes that determine what you look like. Genes from you and your partner are forming a baby. The female genes determine whether the baby is a boy or a girl.  · Your body increases in girth and you may feel bloated.  · Feeling sick to your stomach (nauseous) and throwing up (vomiting). If the vomiting is uncontrollable, call your caregiver.  · Your breasts will begin to enlarge and become tender.  · Your nipples may stick out more and become darker.  · The need to urinate more. Painful urination may mean you have a bladder infection.  · Tiring easily.  · Loss of appetite.  · Cravings for certain kinds of food.  · At first, you may gain or lose a couple of pounds.  · You may have changes in your emotions from day to day (excited to be pregnant or concerned something may go wrong with the pregnancy and baby).  · You may have more vivid and strange dreams.  HOME CARE INSTRUCTIONS   ·   It is very important to avoid all smoking, alcohol and non-prescribed drugs during your pregnancy. These affect the formation and growth of the baby. Avoid chemicals while pregnant to ensure the delivery of a healthy infant.  · Start your prenatal visits by the 12th week of pregnancy. They are usually scheduled monthly at first, then more often in the last 2 months before delivery. Keep your caregiver's appointments. Follow your caregiver's instructions regarding medicine use, blood and lab tests, exercise, and diet.  · During pregnancy, you are providing food for you and your baby. Eat regular, well-balanced  meals. Choose foods such as meat, fish, milk and other low fat dairy products, vegetables, fruits, and whole-grain breads and cereals. Your caregiver will tell you of the ideal weight gain.  · You can help morning sickness by keeping soda crackers at the bedside. Eat a couple before arising in the morning. You may want to use the crackers without salt on them.  · Eating 4 to 5 small meals rather than 3 large meals a day also may help the nausea and vomiting.  · Drinking liquids between meals instead of during meals also seems to help nausea and vomiting.  · A physical sexual relationship may be continued throughout pregnancy if there are no other problems. Problems may be early (premature) leaking of amniotic fluid from the membranes, vaginal bleeding, or belly (abdominal) pain.  · Exercise regularly if there are no restrictions. Check with your caregiver or physical therapist if you are unsure of the safety of some of your exercises. Greater weight gain will occur in the last 2 trimesters of pregnancy. Exercising will help:  · Control your weight.  · Keep you in shape.  · Prepare you for labor and delivery.  · Help you lose your pregnancy weight after you deliver your baby.  · Wear a good support or jogging bra for breast tenderness during pregnancy. This may help if worn during sleep too.  · Ask when prenatal classes are available. Begin classes when they are offered.  · Do not use hot tubs, steam rooms, or saunas.  · Wear your seat belt when driving. This protects you and your baby if you are in an accident.  · Avoid raw meat, uncooked cheese, cat litter boxes, and soil used by cats throughout the pregnancy. These carry germs that can cause birth defects in the baby.  · The first trimester is a good time to visit your dentist for your dental health. Getting your teeth cleaned is okay. Use a softer toothbrush and brush gently during pregnancy.  · Ask for help if you have financial, counseling, or nutritional needs  during pregnancy. Your caregiver will be able to offer counseling for these needs as well as refer you for other special needs.  · Do not take any medicines or herbs unless told by your caregiver.  · Inform your caregiver if there is any mental or physical domestic violence.  · Make a list of emergency phone numbers of family, friends, hospital, and police and fire departments.  · Write down your questions. Take them to your prenatal visit.  · Do not douche.  · Do not cross your legs.  · If you have to stand for long periods of time, rotate you feet or take small steps in a circle.  · You may have more vaginal secretions that may require a sanitary pad. Do not use tampons or scented sanitary pads.  MEDICINES AND DRUG USE IN PREGNANCY  ·   Take prenatal vitamins as directed. The vitamin should contain 1 milligram of folic acid. Keep all vitamins out of reach of children. Only a couple vitamins or tablets containing iron may be fatal to a baby or young child when ingested.  · Avoid use of all medicines, including herbs, over-the-counter medicines, not prescribed or suggested by your caregiver. Only take over-the-counter or prescription medicines for pain, discomfort, or fever as directed by your caregiver. Do not use aspirin, ibuprofen, or naproxen unless directed by your caregiver.  · Let your caregiver also know about herbs you may be using.  · Alcohol is related to a number of birth defects. This includes fetal alcohol syndrome. All alcohol, in any form, should be avoided completely. Smoking will cause low birth rate and premature babies.  · Street or illegal drugs are very harmful to the baby. They are absolutely forbidden. A baby born to an addicted mother will be addicted at birth. The baby will go through the same withdrawal an adult does.  · Let your caregiver know about any medicines that you have to take and for what reason you take them.  SEEK MEDICAL CARE IF:   You have any concerns or worries during your  pregnancy. It is better to call with your questions if you feel they cannot wait, rather than worry about them.  SEEK IMMEDIATE MEDICAL CARE IF:   · An unexplained oral temperature above 102° F (38.9° C) develops, or as your caregiver suggests.  · You have leaking of fluid from the vagina (birth canal). If leaking membranes are suspected, take your temperature and inform your caregiver of this when you call.  · There is vaginal spotting or bleeding. Notify your caregiver of the amount and how many pads are used.  · You develop a bad smelling vaginal discharge with a change in the color.  · You continue to feel sick to your stomach (nauseated) and have no relief from remedies suggested. You vomit blood or coffee ground-like materials.  · You lose more than 2 pounds of weight in 1 week.  · You gain more than 2 pounds of weight in 1 week and you notice swelling of your face, hands, feet, or legs.  · You gain 5 pounds or more in 1 week (even if you do not have swelling of your hands, face, legs, or feet).  · You get exposed to German measles and have never had them.  · You are exposed to fifth disease or chickenpox.  · You develop belly (abdominal) pain. Round ligament discomfort is a common non-cancerous (benign) cause of abdominal pain in pregnancy. Your caregiver still must evaluate this.  · You develop headache, fever, diarrhea, pain with urination, or shortness of breath.  · You fall or are in a car accident or have any kind of trauma.  · There is mental or physical violence in your home.  Document Released: 06/05/2001 Document Revised: 03/05/2012 Document Reviewed: 12/07/2008  [employer]® Patient Information ©2014 [employer].

## 2013-10-29 NOTE — MAU Note (Signed)
PT SAYS  HER LMP-  WAS DEC- 2014      NO BIRTH CONTROL.   LAST SEX-   5-5.     DID HPT-   MON- POSITIVE,    SAYS NO PROBLEMS.

## 2013-10-29 NOTE — MAU Note (Signed)
Pt denies any pain or complications.  Desires pregnancy verification letter.  Doppler [id number].

## 2013-10-29 NOTE — MAU Provider Note (Signed)
History     CSN: [id number]  Arrival date and time: 10/29/13 1723   First Provider Initiated Contact with Patient 10/29/13 1947      No chief complaint on file.  HPI This is a 23 y.o.  female at 265w1d by LMP who presents with request for pregnancy verification letter. Denies pain or bleeding.   RN Note: PT SAYS HER LMP- WAS DEC- 2014 NO BIRTH CONTROL. LAST SEX- 5-5. DID HPT- MON- POSITIVE, SAYS NO PROBLEMS.       Pt denies any pain or complications. Desires pregnancy verification letter. Doppler [id number].        OB History   Grav Para Term Preterm Abortions TAB SAB Ect Mult Living   1               No past medical history on file.  No past surgical history on file.  No family history on file.  History  Substance Use Topics  . Smoking status: Former Games developermoker  . Smokeless tobacco: Not on file  . Alcohol Use: No    Allergies: No Known Allergies  No prescriptions prior to admission    Review of Systems  Constitutional: Negative for fever, chills and malaise/fatigue.  Gastrointestinal: Negative for nausea, vomiting and abdominal pain.  Neurological: Negative for dizziness.   Physical Exam   Blood pressure 111/48, pulse 80, temperature 98.3 F (36.8 C), temperature source Oral, resp. rate 20, height 5\' 2"  (1.575 m), weight 81.874 kg (180 lb 8 oz), last menstrual period 06/12/2013.  Physical Exam  Constitutional: She is oriented to person, place, and time. She appears well-developed and well-nourished. No distress.  Cardiovascular: Normal rate.   Respiratory: Effort normal.  GI: Soft. There is no tenderness. There is no rebound and no guarding.  Musculoskeletal: Normal range of motion.  Neurological: She is alert and oriented to person, place, and time.  Skin: Skin is warm and dry.  Psychiatric: She has a normal mood and affect.   Doppler FHR 152 Fundal height about 12-16 weeks  MAU Course  Procedures  MDM Results for orders placed during the hospital  encounter of 10/29/13 (from the past 72 hour(s))  URINALYSIS, ROUTINE W REFLEX MICROSCOPIC     Status: Abnormal   Collection Time    10/29/13  5:45 PM      Result Value Ref Range   Color, Urine YELLOW  YELLOW   APPearance CLEAR  CLEAR   Specific Gravity, Urine 1.015  1.005 - 1.030   pH 7.0  5.0 - 8.0   Glucose, UA NEGATIVE  NEGATIVE mg/dL   Hgb urine dipstick NEGATIVE  NEGATIVE   Bilirubin Urine NEGATIVE  NEGATIVE   Ketones, ur NEGATIVE  NEGATIVE mg/dL   Protein, ur NEGATIVE  NEGATIVE mg/dL   Urobilinogen, UA 2.0 (*) 0.0 - 1.0 mg/dL   Nitrite NEGATIVE  NEGATIVE   Leukocytes, UA NEGATIVE  NEGATIVE   Comment: MICROSCOPIC NOT DONE ON URINES WITH NEGATIVE PROTEIN, BLOOD, LEUKOCYTES, NITRITE, OR GLUCOSE <1000 mg/dL.  POCT PREGNANCY, URINE     Status: Abnormal   Collection Time    10/29/13  5:56 PM      Result Value Ref Range   Preg Test, Ur POSITIVE (*) NEGATIVE   Comment:            THE SENSITIVITY OF THIS     METHODOLOGY IS >24 mIU/mL     Assessment and Plan  A:  SIUP at 2565w1d by LMP but 12-16 week by fundal height  No complaints  P:  Discharge home       Pregnancy verification letter given       Referred to Health Dept  Aviva SignsMarie L Williams 10/29/2013, 8:03 PM

## 2013-11-02 NOTE — MAU Provider Note (Signed)
Attestation of Attending Supervision of Advanced Practitioner (CNM/NP): Evaluation and management procedures were performed by the Advanced Practitioner under my supervision and collaboration.  I have reviewed the Advanced Practitioner's note and chart, and I agree with the management and plan.  Shakila Mak 11/02/2013 7:36 AM

## 2013-11-05 ENCOUNTER — Encounter: Payer: Self-pay | Admitting: Advanced Practice Midwife

## 2013-11-05 ENCOUNTER — Other Ambulatory Visit (HOSPITAL_COMMUNITY): Payer: Self-pay | Admitting: Advanced Practice Midwife

## 2013-11-05 ENCOUNTER — Ambulatory Visit (HOSPITAL_COMMUNITY)
Admission: RE | Admit: 2013-11-05 | Discharge: 2013-11-05 | Disposition: A | Discharge status: Home / Self Care | Source: Ambulatory Visit | Attending: Advanced Practice Midwife | Admitting: Advanced Practice Midwife

## 2013-11-05 DIAGNOSIS — N912 Amenorrhea, unspecified: Secondary | ICD-10-CM

## 2013-11-05 DIAGNOSIS — Z3689 Encounter for other specified antenatal screening: Secondary | ICD-10-CM | POA: Insufficient documentation

## 2013-11-05 DIAGNOSIS — O26849 Uterine size-date discrepancy, unspecified trimester: Secondary | ICD-10-CM | POA: Insufficient documentation

## 2013-11-30 ENCOUNTER — Encounter: Payer: Self-pay | Admitting: *Deleted

## 2013-12-10 DIAGNOSIS — E669 Obesity, unspecified: Secondary | ICD-10-CM | POA: Insufficient documentation

## 2013-12-10 DIAGNOSIS — E785 Hyperlipidemia, unspecified: Secondary | ICD-10-CM | POA: Insufficient documentation

## 2013-12-10 DIAGNOSIS — K219 Gastro-esophageal reflux disease without esophagitis: Secondary | ICD-10-CM | POA: Insufficient documentation

## 2013-12-21 ENCOUNTER — Ambulatory Visit (INDEPENDENT_AMBULATORY_CARE_PROVIDER_SITE_OTHER): Payer: Medicaid Other | Admitting: Obstetrics and Gynecology

## 2013-12-21 ENCOUNTER — Encounter: Payer: Self-pay | Admitting: Obstetrics and Gynecology

## 2013-12-21 VITALS — BP 114/64 | HR 88 | Temp 98.1°F | Wt 184.7 lb

## 2013-12-21 DIAGNOSIS — O0932 Supervision of pregnancy with insufficient antenatal care, second trimester: Secondary | ICD-10-CM

## 2013-12-21 DIAGNOSIS — Z3402 Encounter for supervision of normal first pregnancy, second trimester: Secondary | ICD-10-CM

## 2013-12-21 DIAGNOSIS — O0930 Supervision of pregnancy with insufficient antenatal care, unspecified trimester: Secondary | ICD-10-CM | POA: Insufficient documentation

## 2013-12-21 DIAGNOSIS — E669 Obesity, unspecified: Secondary | ICD-10-CM

## 2013-12-21 DIAGNOSIS — Z3400 Encounter for supervision of normal first pregnancy, unspecified trimester: Secondary | ICD-10-CM | POA: Insufficient documentation

## 2013-12-21 LAB — POCT URINALYSIS DIP (DEVICE)
Bilirubin Urine: NEGATIVE
Glucose, UA: NEGATIVE mg/dL
Hgb urine dipstick: NEGATIVE
KETONES UR: NEGATIVE mg/dL
NITRITE: NEGATIVE
PH: 6.5 (ref 5.0–8.0)
PROTEIN: NEGATIVE mg/dL
Specific Gravity, Urine: 1.025 (ref 1.005–1.030)
UROBILINOGEN UA: 1 mg/dL (ref 0.0–1.0)

## 2013-12-21 MED ORDER — PRENATAL VITAMINS 0.8 MG PO TABS: 1.0000 | ORAL_TABLET | Freq: Every day | ORAL | Status: DC

## 2013-12-21 NOTE — Addendum Note (Signed)
Addended by: Gerome ApleyZEYFANG, LINDA L on: 12/21/2013 10:51 AM   Modules accepted: Orders

## 2013-12-21 NOTE — Progress Notes (Signed)
New OB educational material given.

## 2013-12-21 NOTE — Addendum Note (Signed)
Addended by: Catalina AntiguaONSTANT, PEGGY on: 12/21/2013 10:20 AM   Modules accepted: Orders

## 2013-12-21 NOTE — Progress Notes (Deleted)
Nutrition note: 1st visit consult Pt has h/o obesity. Pt has lost 7.3# @ 23w, but pt has gained wt since beginning of May. Pt reports she changed a few eating habits, which could explain wt loss. Pt reports eating 3-4 meals & 0-1 snack/d. Pt has been taking a PNV. Pt reports no N/V or heartburn. NKFA. Pt is walking most days. Pt received verbal & written education on general nutrition during pregnancy. Encouraged protein with all meals & snacks. Discussed wt gain goals of 11-20# or 0.5#/wk. Pt agrees to continue taking a PNV & eat energy dense snacks. Pt has WIC & plans to BF. F/u as needed Blondell RevealLaura Smith, MS, RD, LDN, [facility]

## 2013-12-21 NOTE — Progress Notes (Signed)
U/S scheduled 12/22/13 at 730 am.

## 2013-12-21 NOTE — Progress Notes (Signed)
Nutrition note: 1st visit consult  Pt has h/o obesity.  Pt has lost 7.3# @ 23w, but pt has gained 4.2# since beginning of May. Pt reports she changed a few eating habits, which could explain wt loss.  Pt reports eating 3-4 meals & 0-1 snack/d.  Pt has been taking a PNV.  Pt reports no N/V or heartburn. NKFA.  Pt is walking most days.  Pt received verbal & written education on general nutrition during pregnancy. Encouraged protein with all meals & snacks.  Discussed wt gain goals of 11-20# or 0.5#/wk.  Pt agrees to continue taking a PNV & eat energy dense snacks.  Pt has WIC & plans to BF.  F/u as needed  Blondell RevealLaura Smith, MS, RD, LDN, [facility]CLC

## 2013-12-21 NOTE — Progress Notes (Signed)
Pt needs prescription for prenatal vitamins, and wants to know if she can take over the counter medication for BV.  Pt does not feel the baby.

## 2013-12-21 NOTE — Patient Instructions (Signed)
Second Trimester of Pregnancy The second trimester is from week 13 through week 28, months 4 through 6. The second trimester is often a time when you feel your best. Your body has also adjusted to being pregnant, and you begin to feel better physically. Usually, morning sickness has lessened or quit completely, you may have more energy, and you may have an increase in appetite. The second trimester is also a time when the fetus is growing rapidly. At the end of the sixth month, the fetus is about 9 inches long and weighs about 1 pounds. You will likely begin to feel the baby move (quickening) between 18 and 20 weeks of the pregnancy. BODY CHANGES Your body goes through many changes during pregnancy. The changes vary from woman to woman.   Your weight will continue to increase. You will notice your lower abdomen bulging out.  You may begin to get stretch marks on your hips, abdomen, and breasts.  You may develop headaches that can be relieved by medicines approved by your health care provider.  You may urinate more often because the fetus is pressing on your bladder.  You may develop or continue to have heartburn as a result of your pregnancy.  You may develop constipation because certain hormones are causing the muscles that push waste through your intestines to slow down.  You may develop hemorrhoids or swollen, bulging veins (varicose veins).  You may have back pain because of the weight gain and pregnancy hormones relaxing your joints between the bones in your pelvis and as a result of a shift in weight and the muscles that support your balance.  Your breasts will continue to grow and be tender.  Your gums may bleed and may be sensitive to brushing and flossing.  Dark spots or blotches (chloasma, mask of pregnancy) may develop on your face. This will likely fade after the baby is born.  A dark line from your belly button to the pubic area (linea nigra) may appear. This will likely  fade after the baby is born.  You may have changes in your hair. These can include thickening of your hair, rapid growth, and changes in texture. Some women also have hair loss during or after pregnancy, or hair that feels dry or thin. Your hair will most likely return to normal after your baby is born. WHAT TO EXPECT AT YOUR PRENATAL VISITS During a routine prenatal visit:  You will be weighed to make sure you and the fetus are growing normally.  Your blood pressure will be taken.  Your abdomen will be measured to track your baby's growth.  The fetal heartbeat will be listened to.  Any test results from the previous visit will be discussed. Your health care provider may ask you:  How you are feeling.  If you are feeling the baby move.  If you have had any abnormal symptoms, such as leaking fluid, bleeding, severe headaches, or abdominal cramping.  If you have any questions. Other tests that may be performed during your second trimester include:  Blood tests that check for:  Low iron levels (anemia).  Gestational diabetes (between 24 and 28 weeks).  Rh antibodies.  Urine tests to check for infections, diabetes, or protein in the urine.  An ultrasound to confirm the proper growth and development of the baby.  An amniocentesis to check for possible genetic problems.  Fetal screens for spina bifida and Down syndrome. HOME CARE INSTRUCTIONS   Avoid all smoking, herbs, alcohol, and unprescribed   drugs. These chemicals affect the formation and growth of the baby.  Follow your health care provider's instructions regarding medicine use. There are medicines that are either safe or unsafe to take during pregnancy.  Exercise only as directed by your health care provider. Experiencing uterine cramps is a good sign to stop exercising.  Continue to eat regular, healthy meals.  Wear a good support bra for breast tenderness.  Do not use hot tubs, steam rooms, or saunas.  Wear  your seat belt at all times when driving.  Avoid raw meat, uncooked cheese, cat litter boxes, and soil used by cats. These carry germs that can cause birth defects in the baby.  Take your prenatal vitamins.  Try taking a stool softener (if your health care provider approves) if you develop constipation. Eat more high-fiber foods, such as fresh vegetables or fruit and whole grains. Drink plenty of fluids to keep your urine clear or pale yellow.  Take warm sitz baths to soothe any pain or discomfort caused by hemorrhoids. Use hemorrhoid cream if your health care provider approves.  If you develop varicose veins, wear support hose. Elevate your feet for 15 minutes, 3-4 times a day. Limit salt in your diet.  Avoid heavy lifting, wear low heel shoes, and practice good posture.  Rest with your legs elevated if you have leg cramps or low back pain.  Visit your dentist if you have not gone yet during your pregnancy. Use a soft toothbrush to brush your teeth and be gentle when you floss.  A sexual relationship may be continued unless your health care provider directs you otherwise.  Continue to go to all your prenatal visits as directed by your health care provider. SEEK MEDICAL CARE IF:   You have dizziness.  You have mild pelvic cramps, pelvic pressure, or nagging pain in the abdominal area.  You have persistent nausea, vomiting, or diarrhea.  You have a bad smelling vaginal discharge.  You have pain with urination. SEEK IMMEDIATE MEDICAL CARE IF:   You have a fever.  You are leaking fluid from your vagina.  You have spotting or bleeding from your vagina.  You have severe abdominal cramping or pain.  You have rapid weight gain or loss.  You have shortness of breath with chest pain.  You notice sudden or extreme swelling of your face, hands, ankles, feet, or legs.  You have not felt your baby move in over an hour.  You have severe headaches that do not go away with  medicine.  You have vision changes. Document Released: 06/05/2001 Document Revised: 06/16/2013 Document Reviewed: 08/12/2012 [employer] Patient Information 2015 [employer]. This information is not intended to replace advice given to you by your health care provider. Make sure you discuss any questions you have with your health care provider.  Contraception Choices Contraception (birth control) is the use of any methods or devices to prevent pregnancy. Below are some methods to help avoid pregnancy. HORMONAL METHODS   Contraceptive implant. This is a thin, plastic tube containing progesterone hormone. It does not contain estrogen hormone. Your health care provider inserts the tube in the inner part of the upper arm. The tube can remain in place for up to 3 years. After 3 years, the implant must be removed. The implant prevents the ovaries from releasing an egg (ovulation), thickens the cervical mucus to prevent sperm from entering the uterus, and thins the lining of the inside of the uterus.  Progesterone-only injections. These injections are given   every 3 months by your health care provider to prevent pregnancy. This synthetic progesterone hormone stops the ovaries from releasing eggs. It also thickens cervical mucus and changes the uterine lining. This makes it harder for sperm to survive in the uterus.  Birth control pills. These pills contain estrogen and progesterone hormone. They work by preventing the ovaries from releasing eggs (ovulation). They also cause the cervical mucus to thicken, preventing the sperm from entering the uterus. Birth control pills are prescribed by a health care provider.Birth control pills can also be used to treat heavy periods.  Minipill. This type of birth control pill contains only the progesterone hormone. They are taken every day of each month and must be prescribed by your health care provider.  Birth control patch. The patch contains hormones similar to  those in birth control pills. It must be changed once a week and is prescribed by a health care provider.  Vaginal ring. The ring contains hormones similar to those in birth control pills. It is left in the vagina for 3 weeks, removed for 1 week, and then a new one is put back in place. The patient must be comfortable inserting and removing the ring from the vagina.A health care provider's prescription is necessary.  Emergency contraception. Emergency contraceptives prevent pregnancy after unprotected sexual intercourse. This pill can be taken right after sex or up to 5 days after unprotected sex. It is most effective the sooner you take the pills after having sexual intercourse. Most emergency contraceptive pills are available without a prescription. Check with your pharmacist. Do not use emergency contraception as your only form of birth control. BARRIER METHODS   Female condom. This is a thin sheath (latex or rubber) that is worn over the penis during sexual intercourse. It can be used with spermicide to increase effectiveness.  Female condom. This is a soft, loose-fitting sheath that is put into the vagina before sexual intercourse.  Diaphragm. This is a soft, latex, dome-shaped barrier that must be fitted by a health care provider. It is inserted into the vagina, along with a spermicidal jelly. It is inserted before intercourse. The diaphragm should be left in the vagina for 6 to 8 hours after intercourse.  Cervical cap. This is a round, soft, latex or plastic cup that fits over the cervix and must be fitted by a health care provider. The cap can be left in place for up to 48 hours after intercourse.  Sponge. This is a soft, circular piece of polyurethane foam. The sponge has spermicide in it. It is inserted into the vagina after wetting it and before sexual intercourse.  Spermicides. These are chemicals that kill or block sperm from entering the cervix and uterus. They come in the form of  creams, jellies, suppositories, foam, or tablets. They do not require a prescription. They are inserted into the vagina with an applicator before having sexual intercourse. The process must be repeated every time you have sexual intercourse. INTRAUTERINE CONTRACEPTION  Intrauterine device (IUD). This is a T-shaped device that is put in a woman's uterus during a menstrual period to prevent pregnancy. There are 2 types:  Copper IUD. This type of IUD is wrapped in copper wire and is placed inside the uterus. Copper makes the uterus and fallopian tubes produce a fluid that kills sperm. It can stay in place for 10 years.  Hormone IUD. This type of IUD contains the hormone progestin (synthetic progesterone). The hormone thickens the cervical mucus and prevents sperm from   entering the uterus, and it also thins the uterine lining to prevent implantation of a fertilized egg. The hormone can weaken or kill the sperm that get into the uterus. It can stay in place for 3-5 years, depending on which type of IUD is used. PERMANENT METHODS OF CONTRACEPTION  Female tubal ligation. This is when the woman's fallopian tubes are surgically sealed, tied, or blocked to prevent the egg from traveling to the uterus.  Hysteroscopic sterilization. This involves placing a small coil or insert into each fallopian tube. Your doctor uses a technique called hysteroscopy to do the procedure. The device causes scar tissue to form. This results in permanent blockage of the fallopian tubes, so the sperm cannot fertilize the egg. It takes about 3 months after the procedure for the tubes to become blocked. You must use another form of birth control for these 3 months.  Female sterilization. This is when the female has the tubes that carry sperm tied off (vasectomy).This blocks sperm from entering the vagina during sexual intercourse. After the procedure, the man can still ejaculate fluid (semen). NATURAL PLANNING METHODS  Natural family  planning. This is not having sexual intercourse or using a barrier method (condom, diaphragm, cervical cap) on days the woman could become pregnant.  Calendar method. This is keeping track of the length of each menstrual cycle and identifying when you are fertile.  Ovulation method. This is avoiding sexual intercourse during ovulation.  Symptothermal method. This is avoiding sexual intercourse during ovulation, using a thermometer and ovulation symptoms.  Post-ovulation method. This is timing sexual intercourse after you have ovulated. Regardless of which type or method of contraception you choose, it is important that you use condoms to protect against the transmission of sexually transmitted infections (STIs). Talk with your health care provider about which form of contraception is most appropriate for you. Document Released: 06/11/2005 Document Revised: 06/16/2013 Document Reviewed: 12/04/2012 [employer] Patient Information 2015 [employer]. This information is not intended to replace advice given to you by your health care provider. Make sure you discuss any questions you have with your health care provider.  Breastfeeding Deciding to breastfeed is one of the best choices you can make for you and your baby. A change in hormones during pregnancy causes your breast tissue to grow and increases the number and size of your milk ducts. These hormones also allow proteins, sugars, and fats from your blood supply to make breast milk in your milk-producing glands. Hormones prevent breast milk from being released before your baby is born as well as prompt milk flow after birth. Once breastfeeding has begun, thoughts of your baby, as well as his or her sucking or crying, can stimulate the release of milk from your milk-producing glands.  BENEFITS OF BREASTFEEDING For Your Baby  Your first milk (colostrum) helps your baby's digestive system function better.   There are antibodies in your milk that  help your baby fight off infections.   Your baby has a lower incidence of asthma, allergies, and sudden infant death syndrome.   The nutrients in breast milk are better for your baby than infant formulas and are designed uniquely for your baby's needs.   Breast milk improves your baby's brain development.   Your baby is less likely to develop other conditions, such as childhood obesity, asthma, or type 2 diabetes mellitus.  For You   Breastfeeding helps to create a very special bond between you and your baby.   Breastfeeding is convenient. Breast milk is   always available at the correct temperature and costs nothing.   Breastfeeding helps to burn calories and helps you lose the weight gained during pregnancy.   Breastfeeding makes your uterus contract to its prepregnancy size faster and slows bleeding (lochia) after you give birth.   Breastfeeding helps to lower your risk of developing type 2 diabetes mellitus, osteoporosis, and breast or ovarian cancer later in life. SIGNS THAT YOUR BABY IS HUNGRY Early Signs of Hunger  Increased alertness or activity.  Stretching.  Movement of the head from side to side.  Movement of the head and opening of the mouth when the corner of the mouth or cheek is stroked (rooting).  Increased sucking sounds, smacking lips, cooing, sighing, or squeaking.  Hand-to-mouth movements.  Increased sucking of fingers or hands. Late Signs of Hunger  Fussing.  Intermittent crying. Extreme Signs of Hunger Signs of extreme hunger will require calming and consoling before your baby will be able to breastfeed successfully. Do not wait for the following signs of extreme hunger to occur before you initiate breastfeeding:   Restlessness.  A loud, strong cry.   Screaming. BREASTFEEDING BASICS Breastfeeding Initiation  Find a comfortable place to sit or lie down, with your neck and back well supported.  Place a pillow or rolled up blanket  under your baby to bring him or her to the level of your breast (if you are seated). Nursing pillows are specially designed to help support your arms and your baby while you breastfeed.  Make sure that your baby's abdomen is facing your abdomen.   Gently massage your breast. With your fingertips, massage from your chest wall toward your nipple in a circular motion. This encourages milk flow. You may need to continue this action during the feeding if your milk flows slowly.  Support your breast with 4 fingers underneath and your thumb above your nipple. Make sure your fingers are well away from your nipple and your baby's mouth.   Stroke your baby's lips gently with your finger or nipple.   When your baby's mouth is open wide enough, quickly bring your baby to your breast, placing your entire nipple and as much of the colored area around your nipple (areola) as possible into your baby's mouth.   More areola should be visible above your baby's upper lip than below the lower lip.   Your baby's tongue should be between his or her lower gum and your breast.   Ensure that your baby's mouth is correctly positioned around your nipple (latched). Your baby's lips should create a seal on your breast and be turned out (everted).  It is common for your baby to suck about 2-3 minutes in order to start the flow of breast milk. Latching Teaching your baby how to latch on to your breast properly is very important. An improper latch can cause nipple pain and decreased milk supply for you and poor weight gain in your baby. Also, if your baby is not latched onto your nipple properly, he or she may swallow some air during feeding. This can make your baby fussy. Burping your baby when you switch breasts during the feeding can help to get rid of the air. However, teaching your baby to latch on properly is still the best way to prevent fussiness from swallowing air while breastfeeding. Signs that your baby has  successfully latched on to your nipple:    Silent tugging or silent sucking, without causing you pain.   Swallowing heard between every 3-4   sucks.    Muscle movement above and in front of his or her ears while sucking.  Signs that your baby has not successfully latched on to nipple:   Sucking sounds or smacking sounds from your baby while breastfeeding.  Nipple pain. If you think your baby has not latched on correctly, slip your finger into the corner of your baby's mouth to break the suction and place it between your baby's gums. Attempt breastfeeding initiation again. Signs of Successful Breastfeeding Signs from your baby:   A gradual decrease in the number of sucks or complete cessation of sucking.   Falling asleep.   Relaxation of his or her body.   Retention of a small amount of milk in his or her mouth.   Letting go of your breast by himself or herself. Signs from you:  Breasts that have increased in firmness, weight, and size 1-3 hours after feeding.   Breasts that are softer immediately after breastfeeding.  Increased milk volume, as well as a change in milk consistency and color by the fifth day of breastfeeding.   Nipples that are not sore, cracked, or bleeding. Signs That Your Baby is Getting Enough Milk  Wetting at least 3 diapers in a 24-hour period. The urine should be clear and pale yellow by age 5 days.  At least 3 stools in a 24-hour period by age 5 days. The stool should be soft and yellow.  At least 3 stools in a 24-hour period by age 7 days. The stool should be seedy and yellow.  No loss of weight greater than 10% of birth weight during the first 3 days of age.  Average weight gain of 4-7 ounces (113-198 g) per week after age 4 days.  Consistent daily weight gain by age 5 days, without weight loss after the age of 2 weeks. After a feeding, your baby may spit up a small amount. This is common. BREASTFEEDING FREQUENCY AND DURATION Frequent  feeding will help you make more milk and can prevent sore nipples and breast engorgement. Breastfeed when you feel the need to reduce the fullness of your breasts or when your baby shows signs of hunger. This is called "breastfeeding on demand." Avoid introducing a pacifier to your baby while you are working to establish breastfeeding (the first 4-6 weeks after your baby is born). After this time you may choose to use a pacifier. Research has shown that pacifier use during the first year of a baby's life decreases the risk of sudden infant death syndrome (SIDS). Allow your baby to feed on each breast as long as he or she wants. Breastfeed until your baby is finished feeding. When your baby unlatches or falls asleep while feeding from the first breast, offer the second breast. Because newborns are often sleepy in the first few weeks of life, you may need to awaken your baby to get him or her to feed. Breastfeeding times will vary from baby to baby. However, the following rules can serve as a guide to help you ensure that your baby is properly fed:  Newborns (babies 4 weeks of age or younger) may breastfeed every 1-3 hours.  Newborns should not go longer than 3 hours during the day or 5 hours during the night without breastfeeding.  You should breastfeed your baby a minimum of 8 times in a 24-hour period until you begin to introduce solid foods to your baby at around 6 months of age. BREAST MILK PUMPING Pumping and storing breast milk allows   you to ensure that your baby is exclusively fed your breast milk, even at times when you are unable to breastfeed. This is especially important if you are going back to work while you are still breastfeeding or when you are not able to be present during feedings. Your lactation consultant can give you guidelines on how long it is safe to store breast milk.  A breast pump is a machine that allows you to pump milk from your breast into a sterile bottle. The pumped breast  milk can then be stored in a refrigerator or freezer. Some breast pumps are operated by hand, while others use electricity. Ask your lactation consultant which type will work best for you. Breast pumps can be purchased, but some hospitals and breastfeeding support groups lease breast pumps on a monthly basis. A lactation consultant can teach you how to hand express breast milk, if you prefer not to use a pump.  CARING FOR YOUR BREASTS WHILE YOU BREASTFEED Nipples can become dry, cracked, and sore while breastfeeding. The following recommendations can help keep your breasts moisturized and healthy:  Avoid using soap on your nipples.   Wear a supportive bra. Although not required, special nursing bras and tank tops are designed to allow access to your breasts for breastfeeding without taking off your entire bra or top. Avoid wearing underwire-style bras or extremely tight bras.  Air dry your nipples for 3-4minutes after each feeding.   Use only cotton bra pads to absorb leaked breast milk. Leaking of breast milk between feedings is normal.   Use lanolin on your nipples after breastfeeding. Lanolin helps to maintain your skin's normal moisture barrier. If you use pure lanolin, you do not need to wash it off before feeding your baby again. Pure lanolin is not toxic to your baby. You may also hand express a few drops of breast milk and gently massage that milk into your nipples and allow the milk to air dry. In the first few weeks after giving birth, some women experience extremely full breasts (engorgement). Engorgement can make your breasts feel heavy, warm, and tender to the touch. Engorgement peaks within 3-5 days after you give birth. The following recommendations can help ease engorgement:  Completely empty your breasts while breastfeeding or pumping. You may want to start by applying warm, moist heat (in the shower or with warm water-soaked hand towels) just before feeding or pumping. This  increases circulation and helps the milk flow. If your baby does not completely empty your breasts while breastfeeding, pump any extra milk after he or she is finished.  Wear a snug bra (nursing or regular) or tank top for 1-2 days to signal your body to slightly decrease milk production.  Apply ice packs to your breasts, unless this is too uncomfortable for you.  Make sure that your baby is latched on and positioned properly while breastfeeding. If engorgement persists after 48 hours of following these recommendations, contact your health care provider or a lactation consultant. OVERALL HEALTH CARE RECOMMENDATIONS WHILE BREASTFEEDING  Eat healthy foods. Alternate between meals and snacks, eating 3 of each per day. Because what you eat affects your breast milk, some of the foods may make your baby more irritable than usual. Avoid eating these foods if you are sure that they are negatively affecting your baby.  Drink milk, fruit juice, and water to satisfy your thirst (about 10 glasses a day).   Rest often, relax, and continue to take your prenatal vitamins to prevent fatigue,   stress, and anemia.  Continue breast self-awareness checks.  Avoid chewing and smoking tobacco.  Avoid alcohol and drug use. Some medicines that may be harmful to your baby can pass through breast milk. It is important to ask your health care provider before taking any medicine, including all over-the-counter and prescription medicine as well as vitamin and herbal supplements. It is possible to become pregnant while breastfeeding. If birth control is desired, ask your health care provider about options that will be safe for your baby. SEEK MEDICAL CARE IF:   You feel like you want to stop breastfeeding or have become frustrated with breastfeeding.  You have painful breasts or nipples.  Your nipples are cracked or bleeding.  Your breasts are red, tender, or warm.  You have a swollen area on either breast.  You  have a fever or chills.  You have nausea or vomiting.  You have drainage other than breast milk from your nipples.  Your breasts do not become full before feedings by the fifth day after you give birth.  You feel sad and depressed.  Your baby is too sleepy to eat well.  Your baby is having trouble sleeping.   Your baby is wetting less than 3 diapers in a 24-hour period.  Your baby has less than 3 stools in a 24-hour period.  Your baby's skin or the white part of his or her eyes becomes yellow.   Your baby is not gaining weight by 5 days of age. SEEK IMMEDIATE MEDICAL CARE IF:   Your baby is overly tired (lethargic) and does not want to wake up and feed.  Your baby develops an unexplained fever. Document Released: 06/11/2005 Document Revised: 06/16/2013 Document Reviewed: 12/03/2012 [employer] Patient Information 2015 [employer]. This information is not intended to replace advice given to you by your health care provider. Make sure you discuss any questions you have with your health care provider.  

## 2013-12-21 NOTE — Progress Notes (Signed)
   Subjective:    Angela Lynch is a G1P0 652w0d being seen today for her first obstetrical visit.  Her obstetrical history is significant for obesity and late onset of prenatal care at 23 weeks. Patient does intend to breast feed. Pregnancy history fully reviewed.  Patient reports no complaints.  Filed Vitals:   12/21/13 0858  BP: 114/64  Pulse: 88  Temp: 98.1 F (36.7 C)  Weight: 184 lb 11.2 oz (83.779 kg)    HISTORY: OB History  Gravida Para Term Preterm AB SAB TAB Ectopic Multiple Living  1             # Outcome Date GA Lbr Len/2nd Weight Sex Delivery Anes PTL Lv  1 CUR              Past Medical History  Diagnosis Date  . Medical history non-contributory    Past Surgical History  Procedure Laterality Date  . No past surgeries     Family History  Problem Relation Age of Onset  . Parkinson's disease Maternal Grandmother   . Dementia Maternal Grandmother      Exam    Uterus:     Pelvic Exam:    Perineum: No Hemorrhoids, Normal Perineum   Vulva: normal   Vagina:  normal mucosa, normal discharge   pH:    Cervix: closed and long   Adnexa: normal adnexa and no mass, fullness, tenderness   Bony Pelvis: android  System: Breast:  normal appearance, no masses or tenderness   Skin: normal coloration and turgor, no rashes    Neurologic: oriented, no focal deficits   Extremities: normal strength, tone, and muscle mass   HEENT extra ocular movement intact   Mouth/Teeth mucous membranes moist, pharynx normal without lesions and dental hygiene good   Neck supple and no masses   Cardiovascular: regular rate and rhythm   Respiratory:  chest clear, no wheezing, crepitations, rhonchi, normal symmetric air entry   Abdomen: soft, gravid, NT   Urinary:       Assessment:    Pregnancy: G1P0 Patient Active Problem List   Diagnosis Date Noted  . Supervision of normal first pregnancy 12/21/2013    Priority: Medium  . Insufficient prenatal care 12/21/2013   Priority: Medium  . Obesity 12/10/2013    Priority: Medium  . Hyperlipidemia 12/10/2013  . GERD (gastroesophageal reflux disease) 12/10/2013  . Significant discrepancy between uterine size and clinical dates, antepartum 11/05/2013        Plan:     Initial labs drawn. Prenatal vitamins. Problem list reviewed and updated. Genetic Screening discussed Quad Screen: requested.  Ultrasound discussed; fetal survey: requested.  Follow up in 4 weeks. 50% of 30 min visit spent on counseling and coordination of care.     Wolfgang Finigan 12/21/2013

## 2013-12-22 ENCOUNTER — Encounter: Payer: Self-pay | Admitting: Obstetrics and Gynecology

## 2013-12-22 ENCOUNTER — Ambulatory Visit (HOSPITAL_COMMUNITY)
Admission: RE | Admit: 2013-12-22 | Discharge: 2013-12-22 | Disposition: A | Discharge status: Home / Self Care | Payer: Medicaid Other | Source: Ambulatory Visit | Attending: Obstetrics and Gynecology | Admitting: Obstetrics and Gynecology

## 2013-12-22 DIAGNOSIS — Z3689 Encounter for other specified antenatal screening: Secondary | ICD-10-CM | POA: Insufficient documentation

## 2013-12-22 DIAGNOSIS — Z3402 Encounter for supervision of normal first pregnancy, second trimester: Secondary | ICD-10-CM

## 2013-12-22 LAB — OBSTETRIC PANEL
Antibody Screen: NEGATIVE
Basophils Absolute: 0 10*3/uL (ref 0.0–0.1)
Basophils Relative: 0 % (ref 0–1)
EOS PCT: 1 % (ref 0–5)
Eosinophils Absolute: 0.2 10*3/uL (ref 0.0–0.7)
HCT: 32.3 % — ABNORMAL LOW (ref 36.0–46.0)
HEP B S AG: NEGATIVE
Hemoglobin: 10.7 g/dL — ABNORMAL LOW (ref 12.0–15.0)
LYMPHS ABS: 2.2 10*3/uL (ref 0.7–4.0)
LYMPHS PCT: 14 % (ref 12–46)
MCH: 27 pg (ref 26.0–34.0)
MCHC: 33.1 g/dL (ref 30.0–36.0)
MCV: 81.4 fL (ref 78.0–100.0)
Monocytes Absolute: 0.6 10*3/uL (ref 0.1–1.0)
Monocytes Relative: 4 % (ref 3–12)
Neutro Abs: 12.5 10*3/uL — ABNORMAL HIGH (ref 1.7–7.7)
Neutrophils Relative %: 81 % — ABNORMAL HIGH (ref 43–77)
PLATELETS: 159 10*3/uL (ref 150–400)
RBC: 3.97 MIL/uL (ref 3.87–5.11)
RDW: 15.3 % (ref 11.5–15.5)
RUBELLA: 2.29 {index} — AB (ref <0.90)
Rh Type: POSITIVE
WBC: 15.4 10*3/uL — AB (ref 4.0–10.5)

## 2013-12-22 LAB — AFP, QUAD SCREEN
AFP: 80.1 IU/mL
Curr Gest Age: 23 wks.days
Down Syndrome Scr Risk Est: <1:38500 {titer}
HCG TOTAL: 5266 m[IU]/mL
INH: 150.4 pg/mL
Interpretation-AFP: NEGATIVE
MOM FOR HCG: 0.52
MoM for AFP: 1.15
MoM for INH: 0.72
Open Spina bifida: NEGATIVE
TRI 18 SCR RISK EST: NEGATIVE
UE3 VALUE: 3.2 ng/mL
uE3 Mom: 1.45

## 2013-12-22 LAB — GLUCOSE TOLERANCE, 1 HOUR (50G) W/O FASTING: Glucose, 1 Hour GTT: 79 mg/dL (ref 70–140)

## 2013-12-22 LAB — HIV ANTIBODY (ROUTINE TESTING W REFLEX): HIV 1&2 Ab, 4th Generation: NONREACTIVE

## 2013-12-23 LAB — PRESCRIPTION MONITORING PROFILE (19 PANEL)
AMPHETAMINE/METH: NEGATIVE ng/mL
BARBITURATE SCREEN, URINE: NEGATIVE ng/mL
BENZODIAZEPINE SCREEN, URINE: NEGATIVE ng/mL
BUPRENORPHINE, URINE: NEGATIVE ng/mL
CANNABINOID SCRN UR: NEGATIVE ng/mL
Carisoprodol, Urine: NEGATIVE ng/mL
Cocaine Metabolites: NEGATIVE ng/mL
Creatinine, Urine: 211.43 mg/dL (ref >20.0)
Fentanyl, Ur: NEGATIVE ng/mL
MDMA URINE: NEGATIVE ng/mL
METHAQUALONE SCREEN (URINE): NEGATIVE ng/mL
Meperidine, Ur: NEGATIVE ng/mL
Methadone Screen, Urine: NEGATIVE ng/mL
Nitrites, Initial: NEGATIVE ug/mL
OPIATE SCREEN, URINE: NEGATIVE ng/mL
Oxycodone Screen, Ur: NEGATIVE ng/mL
PHENCYCLIDINE, UR: NEGATIVE ng/mL
Propoxyphene: NEGATIVE ng/mL
TAPENTADOLUR: NEGATIVE ng/mL
Tramadol Scrn, Ur: NEGATIVE ng/mL
Zolpidem, Urine: NEGATIVE ng/mL
pH, Initial: 6.6 pH (ref 4.5–8.9)

## 2013-12-23 LAB — CULTURE, OB URINE: Colony Count: 30000

## 2013-12-24 ENCOUNTER — Encounter: Payer: Self-pay | Admitting: Obstetrics and Gynecology

## 2013-12-24 LAB — CYTOLOGY - PAP

## 2013-12-31 ENCOUNTER — Encounter: Payer: Self-pay | Admitting: *Deleted

## 2014-01-13 ENCOUNTER — Encounter: Payer: Self-pay | Admitting: *Deleted

## 2014-01-20 ENCOUNTER — Ambulatory Visit (INDEPENDENT_AMBULATORY_CARE_PROVIDER_SITE_OTHER): Payer: Medicaid Other | Admitting: Advanced Practice Midwife

## 2014-01-20 VITALS — BP 109/50 | HR 84 | Wt 191.4 lb

## 2014-01-20 DIAGNOSIS — Z3400 Encounter for supervision of normal first pregnancy, unspecified trimester: Secondary | ICD-10-CM

## 2014-01-20 DIAGNOSIS — Z3402 Encounter for supervision of normal first pregnancy, second trimester: Secondary | ICD-10-CM

## 2014-01-20 LAB — POCT URINALYSIS DIP (DEVICE)
BILIRUBIN URINE: NEGATIVE
GLUCOSE, UA: NEGATIVE mg/dL
Hgb urine dipstick: NEGATIVE
Ketones, ur: NEGATIVE mg/dL
NITRITE: NEGATIVE
PH: 6.5 (ref 5.0–8.0)
Protein, ur: NEGATIVE mg/dL
Specific Gravity, Urine: 1.025 (ref 1.005–1.030)
UROBILINOGEN UA: 1 mg/dL (ref 0.0–1.0)

## 2014-01-20 NOTE — Progress Notes (Signed)
Doing well.  Good fetal movement, denies vaginal bleeding, LOF, regular contractions.  Early glucose screen normal at 23 weeks at initial visit. Will repeat glucose screen at next visit at 29 weeks.

## 2014-02-02 ENCOUNTER — Encounter: Payer: Self-pay | Admitting: Advanced Practice Midwife

## 2014-02-02 ENCOUNTER — Ambulatory Visit (INDEPENDENT_AMBULATORY_CARE_PROVIDER_SITE_OTHER): Payer: Medicaid Other | Admitting: Advanced Practice Midwife

## 2014-02-02 VITALS — BP 119/56 | HR 94 | Wt 195.1 lb

## 2014-02-02 DIAGNOSIS — Z3403 Encounter for supervision of normal first pregnancy, third trimester: Secondary | ICD-10-CM

## 2014-02-02 DIAGNOSIS — Z23 Encounter for immunization: Secondary | ICD-10-CM

## 2014-02-02 DIAGNOSIS — Z3400 Encounter for supervision of normal first pregnancy, unspecified trimester: Secondary | ICD-10-CM

## 2014-02-02 LAB — HIV ANTIBODY (ROUTINE TESTING W REFLEX): HIV 1&2 Ab, 4th Generation: NONREACTIVE

## 2014-02-02 LAB — POCT URINALYSIS DIP (DEVICE)
BILIRUBIN URINE: NEGATIVE
Glucose, UA: NEGATIVE mg/dL
HGB URINE DIPSTICK: NEGATIVE
Ketones, ur: NEGATIVE mg/dL
LEUKOCYTES UA: NEGATIVE
NITRITE: NEGATIVE
PH: 7 (ref 5.0–8.0)
PROTEIN: NEGATIVE mg/dL
Specific Gravity, Urine: 1.02 (ref 1.005–1.030)
UROBILINOGEN UA: 1 mg/dL (ref 0.0–1.0)

## 2014-02-02 LAB — CBC
HCT: 32.6 % — ABNORMAL LOW (ref 36.0–46.0)
Hemoglobin: 11.1 g/dL — ABNORMAL LOW (ref 12.0–15.0)
MCH: 27.9 pg (ref 26.0–34.0)
MCHC: 34 g/dL (ref 30.0–36.0)
MCV: 81.9 fL (ref 78.0–100.0)
PLATELETS: 160 10*3/uL (ref 150–400)
RBC: 3.98 MIL/uL (ref 3.87–5.11)
RDW: 14.7 % (ref 11.5–15.5)
WBC: 16.1 10*3/uL — ABNORMAL HIGH (ref 4.0–10.5)

## 2014-02-02 LAB — GLUCOSE TOLERANCE, 1 HOUR (50G) W/O FASTING: Glucose, 1 Hour GTT: 104 mg/dL (ref 70–140)

## 2014-02-02 LAB — RPR

## 2014-02-02 MED ORDER — TETANUS-DIPHTH-ACELL PERTUSSIS 5-2.5-18.5 LF-MCG/0.5 IM SUSP: 0.5000 mL | Freq: Once | INTRAMUSCULAR | Status: DC

## 2014-02-02 NOTE — Progress Notes (Signed)
28 week labs. TDaP. Took BF class.

## 2014-02-02 NOTE — Patient Instructions (Signed)
Contraception Choices Contraception (birth control) is the use of any methods or devices to prevent pregnancy. Below are some methods to help avoid pregnancy. HORMONAL METHODS   Contraceptive implant. This is a thin, plastic tube containing progesterone hormone. It does not contain estrogen hormone. Your health care provider inserts the tube in the inner part of the upper arm. The tube can remain in place for up to 3 years. After 3 years, the implant must be removed. The implant prevents the ovaries from releasing an egg (ovulation), thickens the cervical mucus to prevent sperm from entering the uterus, and thins the lining of the inside of the uterus.  Progesterone-only injections. These injections are given every 3 months by your health care provider to prevent pregnancy. This synthetic progesterone hormone stops the ovaries from releasing eggs. It also thickens cervical mucus and changes the uterine lining. This makes it harder for sperm to survive in the uterus.  Birth control pills. These pills contain estrogen and progesterone hormone. They work by preventing the ovaries from releasing eggs (ovulation). They also cause the cervical mucus to thicken, preventing the sperm from entering the uterus. Birth control pills are prescribed by a health care provider.Birth control pills can also be used to treat heavy periods.  Minipill. This type of birth control pill contains only the progesterone hormone. They are taken every day of each month and must be prescribed by your health care provider.  Birth control patch. The patch contains hormones similar to those in birth control pills. It must be changed once a week and is prescribed by a health care provider.  Vaginal ring. The ring contains hormones similar to those in birth control pills. It is left in the vagina for 3 weeks, removed for 1 week, and then a new one is put back in place. The patient must be comfortable inserting and removing the ring  from the vagina.A health care provider's prescription is necessary.  Emergency contraception. Emergency contraceptives prevent pregnancy after unprotected sexual intercourse. This pill can be taken right after sex or up to 5 days after unprotected sex. It is most effective the sooner you take the pills after having sexual intercourse. Most emergency contraceptive pills are available without a prescription. Check with your pharmacist. Do not use emergency contraception as your only form of birth control. BARRIER METHODS   Female condom. This is a thin sheath (latex or rubber) that is worn over the penis during sexual intercourse. It can be used with spermicide to increase effectiveness.  Female condom. This is a soft, loose-fitting sheath that is put into the vagina before sexual intercourse.  Diaphragm. This is a soft, latex, dome-shaped barrier that must be fitted by a health care provider. It is inserted into the vagina, along with a spermicidal jelly. It is inserted before intercourse. The diaphragm should be left in the vagina for 6 to 8 hours after intercourse.  Cervical cap. This is a round, soft, latex or plastic cup that fits over the cervix and must be fitted by a health care provider. The cap can be left in place for up to 48 hours after intercourse.  Sponge. This is a soft, circular piece of polyurethane foam. The sponge has spermicide in it. It is inserted into the vagina after wetting it and before sexual intercourse.  Spermicides. These are chemicals that kill or block sperm from entering the cervix and uterus. They come in the form of creams, jellies, suppositories, foam, or tablets. They do not require a   prescription. They are inserted into the vagina with an applicator before having sexual intercourse. The process must be repeated every time you have sexual intercourse. INTRAUTERINE CONTRACEPTION  Intrauterine device (IUD). This is a T-shaped device that is put in a woman's uterus  during a menstrual period to prevent pregnancy. There are 2 types:  Copper IUD. This type of IUD is wrapped in copper wire and is placed inside the uterus. Copper makes the uterus and fallopian tubes produce a fluid that kills sperm. It can stay in place for 10 years.  Hormone IUD. This type of IUD contains the hormone progestin (synthetic progesterone). The hormone thickens the cervical mucus and prevents sperm from entering the uterus, and it also thins the uterine lining to prevent implantation of a fertilized egg. The hormone can weaken or kill the sperm that get into the uterus. It can stay in place for 3-5 years, depending on which type of IUD is used. PERMANENT METHODS OF CONTRACEPTION  Female tubal ligation. This is when the woman's fallopian tubes are surgically sealed, tied, or blocked to prevent the egg from traveling to the uterus.  Hysteroscopic sterilization. This involves placing a small coil or insert into each fallopian tube. Your doctor uses a technique called hysteroscopy to do the procedure. The device causes scar tissue to form. This results in permanent blockage of the fallopian tubes, so the sperm cannot fertilize the egg. It takes about 3 months after the procedure for the tubes to become blocked. You must use another form of birth control for these 3 months.  Female sterilization. This is when the female has the tubes that carry sperm tied off (vasectomy).This blocks sperm from entering the vagina during sexual intercourse. After the procedure, the man can still ejaculate fluid (semen). NATURAL PLANNING METHODS  Natural family planning. This is not having sexual intercourse or using a barrier method (condom, diaphragm, cervical cap) on days the woman could become pregnant.  Calendar method. This is keeping track of the length of each menstrual cycle and identifying when you are fertile.  Ovulation method. This is avoiding sexual intercourse during ovulation.  Symptothermal  method. This is avoiding sexual intercourse during ovulation, using a thermometer and ovulation symptoms.  Post-ovulation method. This is timing sexual intercourse after you have ovulated. Regardless of which type or method of contraception you choose, it is important that you use condoms to protect against the transmission of sexually transmitted infections (STIs). Talk with your health care provider about which form of contraception is most appropriate for you. Document Released: 06/11/2005 Document Revised: 06/16/2013 Document Reviewed: 12/04/2012 [facility] Patient Information 2015 [place], [place]. This information is not intended to replace advice given to you by your health care provider. Make sure you discuss any questions you have with your health care provider.  Third Trimester of Pregnancy The third trimester is from week 29 through week 42, months 7 through 9. The third trimester is a time when the fetus is growing rapidly. At the end of the ninth month, the fetus is about 20 inches in length and weighs 6-10 pounds.  BODY CHANGES Your body goes through many changes during pregnancy. The changes vary from woman to woman.   Your weight will continue to increase. You can expect to gain 25-35 pounds (11-16 kg) by the end of the pregnancy.  You may begin to get stretch marks on your hips, abdomen, and breasts.  You may urinate more often because the fetus is moving lower into your pelvis and  pressing on your bladder.  You may develop or continue to have heartburn as a result of your pregnancy.  You may develop constipation because certain hormones are causing the muscles that push waste through your intestines to slow down.  You may develop hemorrhoids or swollen, bulging veins (varicose veins).  You may have pelvic pain because of the weight gain and pregnancy hormones relaxing your joints between the bones in your pelvis. Backaches may result from overexertion of the muscles supporting  your posture.  You may have changes in your hair. These can include thickening of your hair, rapid growth, and changes in texture. Some women also have hair loss during or after pregnancy, or hair that feels dry or thin. Your hair will most likely return to normal after your baby is born.  Your breasts will continue to grow and be tender. A yellow discharge may leak from your breasts called colostrum.  Your belly button may stick out.  You may feel short of breath because of your expanding uterus.  You may notice the fetus "dropping," or moving lower in your abdomen.  You may have a bloody mucus discharge. This usually occurs a few days to a week before labor begins.  Your cervix becomes thin and soft (effaced) near your due date. WHAT TO EXPECT AT YOUR PRENATAL EXAMS  You will have prenatal exams every 2 weeks until week 36. Then, you will have weekly prenatal exams. During a routine prenatal visit:  You will be weighed to make sure you and the fetus are growing normally.  Your blood pressure is taken.  Your abdomen will be measured to track your baby's growth.  The fetal heartbeat will be listened to.  Any test results from the previous visit will be discussed.  You may have a cervical check near your due date to see if you have effaced. At around 36 weeks, your caregiver will check your cervix. At the same time, your caregiver will also perform a test on the secretions of the vaginal tissue. This test is to determine if a type of bacteria, Group B streptococcus, is present. Your caregiver will explain this further. Your caregiver may ask you:  What your birth plan is.  How you are feeling.  If you are feeling the baby move.  If you have had any abnormal symptoms, such as leaking fluid, bleeding, severe headaches, or abdominal cramping.  If you have any questions. Other tests or screenings that may be performed during your third trimester include:  Blood tests that check  for low iron levels (anemia).  Fetal testing to check the health, activity level, and growth of the fetus. Testing is done if you have certain medical conditions or if there are problems during the pregnancy. FALSE LABOR You may feel small, irregular contractions that eventually go away. These are called Braxton Hicks contractions, or false labor. Contractions may last for hours, days, or even weeks before true labor sets in. If contractions come at regular intervals, intensify, or become painful, it is best to be seen by your caregiver.  SIGNS OF LABOR   Menstrual-like cramps.  Contractions that are 5 minutes apart or less.  Contractions that start on the top of the uterus and spread down to the lower abdomen and back.  A sense of increased pelvic pressure or back pain.  A watery or bloody mucus discharge that comes from the vagina. If you have any of these signs before the 37th week of pregnancy, call your caregiver  right away. You need to go to the hospital to get checked immediately. HOME CARE INSTRUCTIONS   Avoid all smoking, herbs, alcohol, and unprescribed drugs. These chemicals affect the formation and growth of the baby.  Follow your caregiver's instructions regarding medicine use. There are medicines that are either safe or unsafe to take during pregnancy.  Exercise only as directed by your caregiver. Experiencing uterine cramps is a good sign to stop exercising.  Continue to eat regular, healthy meals.  Wear a good support bra for breast tenderness.  Do not use hot tubs, steam rooms, or saunas.  Wear your seat belt at all times when driving.  Avoid raw meat, uncooked cheese, cat litter boxes, and soil used by cats. These carry germs that can cause birth defects in the baby.  Take your prenatal vitamins.  Try taking a stool softener (if your caregiver approves) if you develop constipation. Eat more high-fiber foods, such as fresh vegetables or fruit and whole grains.  Drink plenty of fluids to keep your urine clear or pale yellow.  Take warm sitz baths to soothe any pain or discomfort caused by hemorrhoids. Use hemorrhoid cream if your caregiver approves.  If you develop varicose veins, wear support hose. Elevate your feet for 15 minutes, 3-4 times a day. Limit salt in your diet.  Avoid heavy lifting, wear low heal shoes, and practice good posture.  Rest a lot with your legs elevated if you have leg cramps or low back pain.  Visit your dentist if you have not gone during your pregnancy. Use a soft toothbrush to brush your teeth and be gentle when you floss.  A sexual relationship may be continued unless your caregiver directs you otherwise.  Do not travel far distances unless it is absolutely necessary and only with the approval of your caregiver.  Take prenatal classes to understand, practice, and ask questions about the labor and delivery.  Make a trial run to the hospital.  Pack your hospital bag.  Prepare the baby's nursery.  Continue to go to all your prenatal visits as directed by your caregiver. SEEK MEDICAL CARE IF:  You are unsure if you are in labor or if your water has broken.  You have dizziness.  You have mild pelvic cramps, pelvic pressure, or nagging pain in your abdominal area.  You have persistent nausea, vomiting, or diarrhea.  You have a bad smelling vaginal discharge.  You have pain with urination. SEEK IMMEDIATE MEDICAL CARE IF:   You have a fever.  You are leaking fluid from your vagina.  You have spotting or bleeding from your vagina.  You have severe abdominal cramping or pain.  You have rapid weight loss or gain.  You have shortness of breath with chest pain.  You notice sudden or extreme swelling of your face, hands, ankles, feet, or legs.  You have not felt your baby move in over an hour.  You have severe headaches that do not go away with medicine.  You have vision changes. Document Released:  06/05/2001 Document Revised: 06/16/2013 Document Reviewed: 08/12/2012 [facility] Patient Information 2015 [place], [place]. This information is not intended to replace advice given to you by your health care provider. Make sure you discuss any questions you have with your health care provider.

## 2014-02-02 NOTE — Progress Notes (Signed)
28 week labs today. 1 hr due at 1010. Patient would like tdap.

## 2014-02-11 ENCOUNTER — Encounter: Payer: Self-pay | Admitting: Advanced Practice Midwife

## 2014-02-17 ENCOUNTER — Encounter: Payer: Self-pay | Admitting: Advanced Practice Midwife

## 2014-02-17 ENCOUNTER — Ambulatory Visit (INDEPENDENT_AMBULATORY_CARE_PROVIDER_SITE_OTHER): Payer: Medicaid Other | Admitting: Advanced Practice Midwife

## 2014-02-17 VITALS — BP 119/62 | HR 89 | Wt 197.4 lb

## 2014-02-17 DIAGNOSIS — O9989 Other specified diseases and conditions complicating pregnancy, childbirth and the puerperium: Secondary | ICD-10-CM

## 2014-02-17 DIAGNOSIS — O26899 Other specified pregnancy related conditions, unspecified trimester: Principal | ICD-10-CM

## 2014-02-17 DIAGNOSIS — O99891 Other specified diseases and conditions complicating pregnancy: Secondary | ICD-10-CM

## 2014-02-17 DIAGNOSIS — G5600 Carpal tunnel syndrome, unspecified upper limb: Secondary | ICD-10-CM

## 2014-02-17 LAB — POCT URINALYSIS DIP (DEVICE)
Bilirubin Urine: NEGATIVE
GLUCOSE, UA: NEGATIVE mg/dL
Hgb urine dipstick: NEGATIVE
Ketones, ur: NEGATIVE mg/dL
Leukocytes, UA: NEGATIVE
Nitrite: NEGATIVE
Protein, ur: NEGATIVE mg/dL
Specific Gravity, Urine: 1.015 (ref 1.005–1.030)
Urobilinogen, UA: 1 mg/dL (ref 0.0–1.0)
pH: 7 (ref 5.0–8.0)

## 2014-02-17 NOTE — Progress Notes (Signed)
Some bilateral carpal tunnel pain. Advised wrist splints

## 2014-02-17 NOTE — Patient Instructions (Signed)
Third Trimester of Pregnancy The third trimester is from week 29 through week 42, months 7 through 9. The third trimester is a time when the fetus is growing rapidly. At the end of the ninth month, the fetus is about 20 inches in length and weighs 6-10 pounds.  BODY CHANGES Your body goes through many changes during pregnancy. The changes vary from woman to woman.   Your weight will continue to increase. You can expect to gain 25-35 pounds (11-16 kg) by the end of the pregnancy.  You may begin to get stretch marks on your hips, abdomen, and breasts.  You may urinate more often because the fetus is moving lower into your pelvis and pressing on your bladder.  You may develop or continue to have heartburn as a result of your pregnancy.  You may develop constipation because certain hormones are causing the muscles that push waste through your intestines to slow down.  You may develop hemorrhoids or swollen, bulging veins (varicose veins).  You may have pelvic pain because of the weight gain and pregnancy hormones relaxing your joints between the bones in your pelvis. Backaches may result from overexertion of the muscles supporting your posture.  You may have changes in your hair. These can include thickening of your hair, rapid growth, and changes in texture. Some women also have hair loss during or after pregnancy, or hair that feels dry or thin. Your hair will most likely return to normal after your baby is born.  Your breasts will continue to grow and be tender. A yellow discharge may leak from your breasts called colostrum.  Your belly button may stick out.  You may feel short of breath because of your expanding uterus.  You may notice the fetus "dropping," or moving lower in your abdomen.  You may have a bloody mucus discharge. This usually occurs a few days to a week before labor begins.  Your cervix becomes thin and soft (effaced) near your due date. WHAT TO EXPECT AT YOUR PRENATAL  EXAMS  You will have prenatal exams every 2 weeks until week 36. Then, you will have weekly prenatal exams. During a routine prenatal visit:  You will be weighed to make sure you and the fetus are growing normally.  Your blood pressure is taken.  Your abdomen will be measured to track your baby's growth.  The fetal heartbeat will be listened to.  Any test results from the previous visit will be discussed.  You may have a cervical check near your due date to see if you have effaced. At around 36 weeks, your caregiver will check your cervix. At the same time, your caregiver will also perform a test on the secretions of the vaginal tissue. This test is to determine if a type of bacteria, Group B streptococcus, is present. Your caregiver will explain this further. Your caregiver may ask you:  What your birth plan is.  How you are feeling.  If you are feeling the baby move.  If you have had any abnormal symptoms, such as leaking fluid, bleeding, severe headaches, or abdominal cramping.  If you have any questions. Other tests or screenings that may be performed during your third trimester include:  Blood tests that check for low iron levels (anemia).  Fetal testing to check the health, activity level, and growth of the fetus. Testing is done if you have certain medical conditions or if there are problems during the pregnancy. FALSE LABOR You may feel small, irregular contractions that   eventually go away. These are called Braxton Hicks contractions, or false labor. Contractions may last for hours, days, or even weeks before true labor sets in. If contractions come at regular intervals, intensify, or become painful, it is best to be seen by your caregiver.  SIGNS OF LABOR   Menstrual-like cramps.  Contractions that are 5 minutes apart or less.  Contractions that start on the top of the uterus and spread down to the lower abdomen and back.  A sense of increased pelvic pressure or back  pain.  A watery or bloody mucus discharge that comes from the vagina. If you have any of these signs before the 37th week of pregnancy, call your caregiver right away. You need to go to the hospital to get checked immediately. HOME CARE INSTRUCTIONS   Avoid all smoking, herbs, alcohol, and unprescribed drugs. These chemicals affect the formation and growth of the baby.  Follow your caregiver's instructions regarding medicine use. There are medicines that are either safe or unsafe to take during pregnancy.  Exercise only as directed by your caregiver. Experiencing uterine cramps is a good sign to stop exercising.  Continue to eat regular, healthy meals.  Wear a good support bra for breast tenderness.  Do not use hot tubs, steam rooms, or saunas.  Wear your seat belt at all times when driving.  Avoid raw meat, uncooked cheese, cat litter boxes, and soil used by cats. These carry germs that can cause birth defects in the baby.  Take your prenatal vitamins.  Try taking a stool softener (if your caregiver approves) if you develop constipation. Eat more high-fiber foods, such as fresh vegetables or fruit and whole grains. Drink plenty of fluids to keep your urine clear or pale yellow.  Take warm sitz baths to soothe any pain or discomfort caused by hemorrhoids. Use hemorrhoid cream if your caregiver approves.  If you develop varicose veins, wear support hose. Elevate your feet for 15 minutes, 3-4 times a day. Limit salt in your diet.  Avoid heavy lifting, wear low heal shoes, and practice good posture.  Rest a lot with your legs elevated if you have leg cramps or low back pain.  Visit your dentist if you have not gone during your pregnancy. Use a soft toothbrush to brush your teeth and be gentle when you floss.  A sexual relationship may be continued unless your caregiver directs you otherwise.  Do not travel far distances unless it is absolutely necessary and only with the approval  of your caregiver.  Take prenatal classes to understand, practice, and ask questions about the labor and delivery.  Make a trial run to the hospital.  Pack your hospital bag.  Prepare the baby's nursery.  Continue to go to all your prenatal visits as directed by your caregiver. SEEK MEDICAL CARE IF:  You are unsure if you are in labor or if your water has broken.  You have dizziness.  You have mild pelvic cramps, pelvic pressure, or nagging pain in your abdominal area.  You have persistent nausea, vomiting, or diarrhea.  You have a bad smelling vaginal discharge.  You have pain with urination. SEEK IMMEDIATE MEDICAL CARE IF:   You have a fever.  You are leaking fluid from your vagina.  You have spotting or bleeding from your vagina.  You have severe abdominal cramping or pain.  You have rapid weight loss or gain.  You have shortness of breath with chest pain.  You notice sudden or extreme swelling   of your face, hands, ankles, feet, or legs.  You have not felt your baby move in over an hour.  You have severe headaches that do not go away with medicine.  You have vision changes. Document Released: 06/05/2001 Document Revised: 06/16/2013 Document Reviewed: 08/12/2012 Exit[employer] Patient Information 2015 [employer]. This information is not intended to replace advice given to you by your health care provider. Make sure you discuss any questions you have with your health care provider.  

## 2014-02-17 NOTE — Progress Notes (Signed)
Patient reports pain in her hands in the morning.

## 2014-03-03 ENCOUNTER — Ambulatory Visit (INDEPENDENT_AMBULATORY_CARE_PROVIDER_SITE_OTHER): Payer: Medicaid Other | Admitting: Advanced Practice Midwife

## 2014-03-03 VITALS — BP 106/59 | HR 90 | Wt 200.5 lb

## 2014-03-03 DIAGNOSIS — Z3403 Encounter for supervision of normal first pregnancy, third trimester: Secondary | ICD-10-CM

## 2014-03-03 DIAGNOSIS — Z3400 Encounter for supervision of normal first pregnancy, unspecified trimester: Secondary | ICD-10-CM

## 2014-03-03 DIAGNOSIS — Z23 Encounter for immunization: Secondary | ICD-10-CM

## 2014-03-03 LAB — POCT URINALYSIS DIP (DEVICE)
GLUCOSE, UA: NEGATIVE mg/dL
HGB URINE DIPSTICK: NEGATIVE
NITRITE: NEGATIVE
Protein, ur: NEGATIVE mg/dL
Specific Gravity, Urine: 1.025 (ref 1.005–1.030)
Urobilinogen, UA: 4 mg/dL — ABNORMAL HIGH (ref 0.0–1.0)
pH: 6.5 (ref 5.0–8.0)

## 2014-03-03 NOTE — Progress Notes (Signed)
Patient is feeling well, no complaints.  +FM, no pain/pressures, no contractions, no bleeding/fluid leakage.  Thinking about Mirena vs OCPs.  Unsure of pediatrician.

## 2014-03-03 NOTE — Progress Notes (Signed)
TDap and Flu vaccine given today.

## 2014-03-03 NOTE — Patient Instructions (Addendum)
Fetal Movement Counts Patient Name: __________________________________________________ Patient Due Date: ____________________ Performing a fetal movement count is highly recommended in high-risk pregnancies, but it is good for every pregnant woman to do. Your health care provider may ask you to start counting fetal movements at 28 weeks of the pregnancy. Fetal movements often increase:  After eating a full meal.  After physical activity.  After eating or drinking something sweet or cold.  At rest. Pay attention to when you feel the baby is most active. This will help you notice a pattern of your baby's sleep and wake cycles and what factors contribute to an increase in fetal movement. It is important to perform a fetal movement count at the same time each day when your baby is normally most active.  HOW TO COUNT FETAL MOVEMENTS 1. Find a quiet and comfortable area to sit or lie down on your left side. Lying on your left side provides the best blood and oxygen circulation to your baby. 2. Write down the day and time on a sheet of paper or in a journal. 3. Start counting kicks, flutters, swishes, rolls, or jabs in a 2-hour period. You should feel at least 10 movements within 2 hours. 4. If you do not feel 10 movements in 2 hours, wait 2-3 hours and count again. Look for a change in the pattern or not enough counts in 2 hours. SEEK MEDICAL CARE IF:  You feel less than 10 counts in 2 hours, tried twice.  There is no movement in over an hour.  The pattern is changing or taking longer each day to reach 10 counts in 2 hours.  You feel the baby is not moving as he or she usually does. Date: ____________ Movements: ____________ Start time: ____________ Finish time: ____________  Date: ____________ Movements: ____________ Start time: ____________ Finish time: ____________ Date: ____________ Movements: ____________ Start time: ____________ Finish time: ____________ Date: ____________ Movements:  ____________ Start time: ____________ Finish time: ____________ Date: ____________ Movements: ____________ Start time: ____________ Finish time: ____________ Date: ____________ Movements: ____________ Start time: ____________ Finish time: ____________ Date: ____________ Movements: ____________ Start time: ____________ Finish time: ____________ Date: ____________ Movements: ____________ Start time: ____________ Finish time: ____________  Date: ____________ Movements: ____________ Start time: ____________ Finish time: ____________ Date: ____________ Movements: ____________ Start time: ____________ Finish time: ____________ Date: ____________ Movements: ____________ Start time: ____________ Finish time: ____________ Date: ____________ Movements: ____________ Start time: ____________ Finish time: ____________ Date: ____________ Movements: ____________ Start time: ____________ Finish time: ____________ Date: ____________ Movements: ____________ Start time: ____________ Finish time: ____________ Date: ____________ Movements: ____________ Start time: ____________ Finish time: ____________  Date: ____________ Movements: ____________ Start time: ____________ Finish time: ____________ Date: ____________ Movements: ____________ Start time: ____________ Finish time: ____________ Date: ____________ Movements: ____________ Start time: ____________ Finish time: ____________ Date: ____________ Movements: ____________ Start time: ____________ Finish time: ____________ Date: ____________ Movements: ____________ Start time: ____________ Finish time: ____________ Date: ____________ Movements: ____________ Start time: ____________ Finish time: ____________ Date: ____________ Movements: ____________ Start time: ____________ Finish time: ____________  Date: ____________ Movements: ____________ Start time: ____________ Finish time: ____________ Date: ____________ Movements: ____________ Start time: ____________ Finish  time: ____________ Date: ____________ Movements: ____________ Start time: ____________ Finish time: ____________ Date: ____________ Movements: ____________ Start time: ____________ Finish time: ____________ Date: ____________ Movements: ____________ Start time: ____________ Finish time: ____________ Date: ____________ Movements: ____________ Start time: ____________ Finish time: ____________ Date: ____________ Movements: ____________ Start time: ____________ Finish time: ____________  Date: ____________ Movements: ____________ Start time: ____________ Finish   time: ____________ Date: ____________ Movements: ____________ Start time: ____________ Doreatha Martin time: ____________ Date: ____________ Movements: ____________ Start time: ____________ Doreatha Martin time: ____________ Date: ____________ Movements: ____________ Start time: ____________ Doreatha Martin time: ____________ Date: ____________ Movements: ____________ Start time: ____________ Doreatha Martin time: ____________ Date: ____________ Movements: ____________ Start time: ____________ Doreatha Martin time: ____________ Date: ____________ Movements: ____________ Start time: ____________ Doreatha Martin time: ____________  Date: ____________ Movements: ____________ Start time: ____________ Doreatha Martin time: ____________ Date: ____________ Movements: ____________ Start time: ____________ Doreatha Martin time: ____________ Date: ____________ Movements: ____________ Start time: ____________ Doreatha Martin time: ____________ Date: ____________ Movements: ____________ Start time: ____________ Doreatha Martin time: ____________ Date: ____________ Movements: ____________ Start time: ____________ Doreatha Martin time: ____________ Date: ____________ Movements: ____________ Start time: ____________ Doreatha Martin time: ____________ Date: ____________ Movements: ____________ Start time: ____________ Doreatha Martin time: ____________  Date: ____________ Movements: ____________ Start time: ____________ Doreatha Martin time: ____________ Date: ____________  Movements: ____________ Start time: ____________ Doreatha Martin time: ____________ Date: ____________ Movements: ____________ Start time: ____________ Doreatha Martin time: ____________ Date: ____________ Movements: ____________ Start time: ____________ Doreatha Martin time: ____________ Date: ____________ Movements: ____________ Start time: ____________ Doreatha Martin time: ____________ Date: ____________ Movements: ____________ Start time: ____________ Doreatha Martin time: ____________ Date: ____________ Movements: ____________ Start time: ____________ Doreatha Martin time: ____________  Date: ____________ Movements: ____________ Start time: ____________ Doreatha Martin time: ____________ Date: ____________ Movements: ____________ Start time: ____________ Doreatha Martin time: ____________ Date: ____________ Movements: ____________ Start time: ____________ Doreatha Martin time: ____________ Date: ____________ Movements: ____________ Start time: ____________ Doreatha Martin time: ____________ Date: ____________ Movements: ____________ Start time: ____________ Doreatha Martin time: ____________ Date: ____________ Movements: ____________ Start time: ____________ Doreatha Martin time: ____________ Document Released: 07/11/2006 Document Revised: 10/26/2013 Document Reviewed: 04/07/2012 [employer] Patient Information 2015 [employer]. This information is not intended to replace advice given to you by your health care provider. Make sure you discuss any questions you have with your health care provider.   Influenza Virus Vaccine injection (Fluarix) What is this medicine? INFLUENZA VIRUS VACCINE (in floo EN zuh VAHY ruhs vak SEEN) helps to reduce the risk of getting influenza also known as the flu. This medicine may be used for other purposes; ask your health care provider or pharmacist if you have questions. COMMON BRAND NAME(S): Fluarix, Fluzone What should I tell my health care provider before I take this medicine? They need to know if you have any of these conditions: -bleeding disorder like  hemophilia -fever or infection -Guillain-Barre syndrome or other neurological problems -immune system problems -infection with the human immunodeficiency virus (HIV) or AIDS -low blood platelet counts -multiple sclerosis -an unusual or allergic reaction to influenza virus vaccine, eggs, chicken proteins, latex, gentamicin, other medicines, foods, dyes or preservatives -pregnant or trying to get pregnant -breast-feeding How should I use this medicine? This vaccine is for injection into a muscle. It is given by a health care professional. A copy of Vaccine Information Statements will be given before each vaccination. Read this sheet carefully each time. The sheet may change frequently. Talk to your pediatrician regarding the use of this medicine in children. Special care may be needed. Overdosage: If you think you have taken too much of this medicine contact a poison control center or emergency room at once. NOTE: This medicine is only for you. Do not share this medicine with others. What if I miss a dose? This does not apply. What may interact with this medicine? -chemotherapy or radiation therapy -medicines that lower your immune system like etanercept, anakinra, infliximab, and adalimumab -medicines that treat or prevent blood clots like warfarin -phenytoin -steroid medicines like prednisone or cortisone -theophylline -vaccines  This list may not describe all possible interactions. Give your health care provider a list of all the medicines, herbs, non-prescription drugs, or dietary supplements you use. Also tell them if you smoke, drink alcohol, or use illegal drugs. Some items may interact with your medicine. What should I watch for while using this medicine? Report any side effects that do not go away within 3 days to your doctor or health care professional. Call your health care provider if any unusual symptoms occur within 6 weeks of receiving this vaccine. You may still catch the  flu, but the illness is not usually as bad. You cannot get the flu from the vaccine. The vaccine will not protect against colds or other illnesses that may cause fever. The vaccine is needed every year. What side effects may I notice from receiving this medicine? Side effects that you should report to your doctor or health care professional as soon as possible: -allergic reactions like skin rash, itching or hives, swelling of the face, lips, or tongue Side effects that usually do not require medical attention (report to your doctor or health care professional if they continue or are bothersome): -fever -headache -muscle aches and pains -pain, tenderness, redness, or swelling at site where injected -weak or tired This list may not describe all possible side effects. Call your doctor for medical advice about side effects. You may report side effects to FDA at 1-800-FDA-1088. Where should I keep my medicine? This vaccine is only given in a clinic, pharmacy, doctor's office, or other health care setting and will not be stored at home. NOTE: This sheet is a summary. It may not cover all possible information. If you have questions about this medicine, talk to your doctor, pharmacist, or health care provider.  2015, [employer]. (2008-01-07 09:30:40)

## 2014-03-17 ENCOUNTER — Ambulatory Visit (INDEPENDENT_AMBULATORY_CARE_PROVIDER_SITE_OTHER): Payer: Medicaid Other | Admitting: Physician Assistant

## 2014-03-17 VITALS — BP 124/63 | HR 94 | Temp 98.2°F | Wt 205.2 lb

## 2014-03-17 DIAGNOSIS — Z3403 Encounter for supervision of normal first pregnancy, third trimester: Secondary | ICD-10-CM

## 2014-03-17 DIAGNOSIS — Z3400 Encounter for supervision of normal first pregnancy, unspecified trimester: Secondary | ICD-10-CM

## 2014-03-17 LAB — POCT URINALYSIS DIP (DEVICE)
BILIRUBIN URINE: NEGATIVE
GLUCOSE, UA: NEGATIVE mg/dL
KETONES UR: NEGATIVE mg/dL
Nitrite: NEGATIVE
Protein, ur: NEGATIVE mg/dL
Specific Gravity, Urine: 1.025 (ref 1.005–1.030)
Urobilinogen, UA: 0.2 mg/dL (ref 0.0–1.0)
pH: 6.5 (ref 5.0–8.0)

## 2014-03-17 NOTE — Progress Notes (Signed)
35 weeks.  Denies vag bleeding, LOF, dysuria. Urine Cx pending.   RTC 1 week

## 2014-03-17 NOTE — Patient Instructions (Signed)
Third Trimester of Pregnancy The third trimester is from week 29 through week 42, months 7 through 9. The third trimester is a time when the fetus is growing rapidly. At the end of the ninth month, the fetus is about 20 inches in length and weighs 6-10 pounds.  BODY CHANGES Your body goes through many changes during pregnancy. The changes vary from woman to woman.   Your weight will continue to increase. You can expect to gain 25-35 pounds (11-16 kg) by the end of the pregnancy.  You may begin to get stretch marks on your hips, abdomen, and breasts.  You may urinate more often because the fetus is moving lower into your pelvis and pressing on your bladder.  You may develop or continue to have heartburn as a result of your pregnancy.  You may develop constipation because certain hormones are causing the muscles that push waste through your intestines to slow down.  You may develop hemorrhoids or swollen, bulging veins (varicose veins).  You may have pelvic pain because of the weight gain and pregnancy hormones relaxing your joints between the bones in your pelvis. Backaches may result from overexertion of the muscles supporting your posture.  You may have changes in your hair. These can include thickening of your hair, rapid growth, and changes in texture. Some women also have hair loss during or after pregnancy, or hair that feels dry or thin. Your hair will most likely return to normal after your baby is born.  Your breasts will continue to grow and be tender. A yellow discharge may leak from your breasts called colostrum.  Your belly button may stick out.  You may feel short of breath because of your expanding uterus.  You may notice the fetus "dropping," or moving lower in your abdomen.  You may have a bloody mucus discharge. This usually occurs a few days to a week before labor begins.  Your cervix becomes thin and soft (effaced) near your due date. WHAT TO EXPECT AT YOUR PRENATAL  EXAMS  You will have prenatal exams every 2 weeks until week 36. Then, you will have weekly prenatal exams. During a routine prenatal visit:  You will be weighed to make sure you and the fetus are growing normally.  Your blood pressure is taken.  Your abdomen will be measured to track your baby's growth.  The fetal heartbeat will be listened to.  Any test results from the previous visit will be discussed.  You may have a cervical check near your due date to see if you have effaced. At around 36 weeks, your caregiver will check your cervix. At the same time, your caregiver will also perform a test on the secretions of the vaginal tissue. This test is to determine if a type of bacteria, Group B streptococcus, is present. Your caregiver will explain this further. Your caregiver may ask you:  What your birth plan is.  How you are feeling.  If you are feeling the baby move.  If you have had any abnormal symptoms, such as leaking fluid, bleeding, severe headaches, or abdominal cramping.  If you have any questions. Other tests or screenings that may be performed during your third trimester include:  Blood tests that check for low iron levels (anemia).  Fetal testing to check the health, activity level, and growth of the fetus. Testing is done if you have certain medical conditions or if there are problems during the pregnancy. FALSE LABOR You may feel small, irregular contractions that   eventually go away. These are called Braxton Hicks contractions, or false labor. Contractions may last for hours, days, or even weeks before true labor sets in. If contractions come at regular intervals, intensify, or become painful, it is best to be seen by your caregiver.  SIGNS OF LABOR   Menstrual-like cramps.  Contractions that are 5 minutes apart or less.  Contractions that start on the top of the uterus and spread down to the lower abdomen and back.  A sense of increased pelvic pressure or back  pain.  A watery or bloody mucus discharge that comes from the vagina. If you have any of these signs before the 37th week of pregnancy, call your caregiver right away. You need to go to the hospital to get checked immediately. HOME CARE INSTRUCTIONS   Avoid all smoking, herbs, alcohol, and unprescribed drugs. These chemicals affect the formation and growth of the baby.  Follow your caregiver's instructions regarding medicine use. There are medicines that are either safe or unsafe to take during pregnancy.  Exercise only as directed by your caregiver. Experiencing uterine cramps is a good sign to stop exercising.  Continue to eat regular, healthy meals.  Wear a good support bra for breast tenderness.  Do not use hot tubs, steam rooms, or saunas.  Wear your seat belt at all times when driving.  Avoid raw meat, uncooked cheese, cat litter boxes, and soil used by cats. These carry germs that can cause birth defects in the baby.  Take your prenatal vitamins.  Try taking a stool softener (if your caregiver approves) if you develop constipation. Eat more high-fiber foods, such as fresh vegetables or fruit and whole grains. Drink plenty of fluids to keep your urine clear or pale yellow.  Take warm sitz baths to soothe any pain or discomfort caused by hemorrhoids. Use hemorrhoid cream if your caregiver approves.  If you develop varicose veins, wear support hose. Elevate your feet for 15 minutes, 3-4 times a day. Limit salt in your diet.  Avoid heavy lifting, wear low heal shoes, and practice good posture.  Rest a lot with your legs elevated if you have leg cramps or low back pain.  Visit your dentist if you have not gone during your pregnancy. Use a soft toothbrush to brush your teeth and be gentle when you floss.  A sexual relationship may be continued unless your caregiver directs you otherwise.  Do not travel far distances unless it is absolutely necessary and only with the approval  of your caregiver.  Take prenatal classes to understand, practice, and ask questions about the labor and delivery.  Make a trial run to the hospital.  Pack your hospital bag.  Prepare the baby's nursery.  Continue to go to all your prenatal visits as directed by your caregiver. SEEK MEDICAL CARE IF:  You are unsure if you are in labor or if your water has broken.  You have dizziness.  You have mild pelvic cramps, pelvic pressure, or nagging pain in your abdominal area.  You have persistent nausea, vomiting, or diarrhea.  You have a bad smelling vaginal discharge.  You have pain with urination. SEEK IMMEDIATE MEDICAL CARE IF:   You have a fever.  You are leaking fluid from your vagina.  You have spotting or bleeding from your vagina.  You have severe abdominal cramping or pain.  You have rapid weight loss or gain.  You have shortness of breath with chest pain.  You notice sudden or extreme swelling   of your face, hands, ankles, feet, or legs.  You have not felt your baby move in over an hour.  You have severe headaches that do not go away with medicine.  You have vision changes. Document Released: 06/05/2001 Document Revised: 06/16/2013 Document Reviewed: 08/12/2012 Exit[employer] Patient Information 2015 [employer]. This information is not intended to replace advice given to you by your health care provider. Make sure you discuss any questions you have with your health care provider.  

## 2014-03-17 NOTE — Progress Notes (Signed)
Edema in hands and feet. C/o of carpel tunnel pain-- to get wrist splints.

## 2014-03-18 LAB — CULTURE, OB URINE: Colony Count: 50000

## 2014-03-29 ENCOUNTER — Other Ambulatory Visit: Payer: Self-pay | Admitting: Obstetrics and Gynecology

## 2014-03-29 ENCOUNTER — Encounter: Payer: Self-pay | Admitting: Obstetrics and Gynecology

## 2014-03-29 ENCOUNTER — Ambulatory Visit (INDEPENDENT_AMBULATORY_CARE_PROVIDER_SITE_OTHER): Payer: Medicaid Other | Admitting: Obstetrics and Gynecology

## 2014-03-29 VITALS — BP 121/60 | HR 99 | Wt 204.0 lb

## 2014-03-29 DIAGNOSIS — Z3403 Encounter for supervision of normal first pregnancy, third trimester: Secondary | ICD-10-CM

## 2014-03-29 LAB — POCT URINALYSIS DIP (DEVICE)
Glucose, UA: NEGATIVE mg/dL
Ketones, ur: NEGATIVE mg/dL
Nitrite: NEGATIVE
Protein, ur: NEGATIVE mg/dL
Specific Gravity, Urine: 1.02 (ref 1.005–1.030)
UROBILINOGEN UA: 2 mg/dL — AB (ref 0.0–1.0)
pH: 6.5 (ref 5.0–8.0)

## 2014-03-29 LAB — OB RESULTS CONSOLE GC/CHLAMYDIA
Chlamydia: NEGATIVE
Gonorrhea: NEGATIVE

## 2014-03-29 LAB — OB RESULTS CONSOLE GBS: GBS: NEGATIVE

## 2014-03-29 NOTE — Addendum Note (Signed)
Addended by: Sherre LainASH, AMANDA A on: 03/29/2014 03:37 PM   Modules accepted: Orders

## 2014-03-29 NOTE — Patient Instructions (Signed)
Third Trimester of Pregnancy The third trimester is from week 29 through week 42, months 7 through 9. The third trimester is a time when the fetus is growing rapidly. At the end of the ninth month, the fetus is about 20 inches in length and weighs 6-10 pounds.  BODY CHANGES Your body goes through many changes during pregnancy. The changes vary from woman to woman.   Your weight will continue to increase. You can expect to gain 25-35 pounds (11-16 kg) by the end of the pregnancy.  You may begin to get stretch marks on your hips, abdomen, and breasts.  You may urinate more often because the fetus is moving lower into your pelvis and pressing on your bladder.  You may develop or continue to have heartburn as a result of your pregnancy.  You may develop constipation because certain hormones are causing the muscles that push waste through your intestines to slow down.  You may develop hemorrhoids or swollen, bulging veins (varicose veins).  You may have pelvic pain because of the weight gain and pregnancy hormones relaxing your joints between the bones in your pelvis. Backaches may result from overexertion of the muscles supporting your posture.  You may have changes in your hair. These can include thickening of your hair, rapid growth, and changes in texture. Some women also have hair loss during or after pregnancy, or hair that feels dry or thin. Your hair will most likely return to normal after your baby is born.  Your breasts will continue to grow and be tender. A yellow discharge may leak from your breasts called colostrum.  Your belly button may stick out.  You may feel short of breath because of your expanding uterus.  You may notice the fetus "dropping," or moving lower in your abdomen.  You may have a bloody mucus discharge. This usually occurs a few days to a week before labor begins.  Your cervix becomes thin and soft (effaced) near your due date. WHAT TO EXPECT AT YOUR PRENATAL  EXAMS  You will have prenatal exams every 2 weeks until week 36. Then, you will have weekly prenatal exams. During a routine prenatal visit:  You will be weighed to make sure you and the fetus are growing normally.  Your blood pressure is taken.  Your abdomen will be measured to track your baby's growth.  The fetal heartbeat will be listened to.  Any test results from the previous visit will be discussed.  You may have a cervical check near your due date to see if you have effaced. At around 36 weeks, your caregiver will check your cervix. At the same time, your caregiver will also perform a test on the secretions of the vaginal tissue. This test is to determine if a type of bacteria, Group B streptococcus, is present. Your caregiver will explain this further. Your caregiver may ask you:  What your birth plan is.  How you are feeling.  If you are feeling the baby move.  If you have had any abnormal symptoms, such as leaking fluid, bleeding, severe headaches, or abdominal cramping.  If you have any questions. Other tests or screenings that may be performed during your third trimester include:  Blood tests that check for low iron levels (anemia).  Fetal testing to check the health, activity level, and growth of the fetus. Testing is done if you have certain medical conditions or if there are problems during the pregnancy. FALSE LABOR You may feel small, irregular contractions that   eventually go away. These are called Braxton Hicks contractions, or false labor. Contractions may last for hours, days, or even weeks before true labor sets in. If contractions come at regular intervals, intensify, or become painful, it is best to be seen by your caregiver.  SIGNS OF LABOR   Menstrual-like cramps.  Contractions that are 5 minutes apart or less.  Contractions that start on the top of the uterus and spread down to the lower abdomen and back.  A sense of increased pelvic pressure or back  pain.  A watery or bloody mucus discharge that comes from the vagina. If you have any of these signs before the 37th week of pregnancy, call your caregiver right away. You need to go to the hospital to get checked immediately. HOME CARE INSTRUCTIONS   Avoid all smoking, herbs, alcohol, and unprescribed drugs. These chemicals affect the formation and growth of the baby.  Follow your caregiver's instructions regarding medicine use. There are medicines that are either safe or unsafe to take during pregnancy.  Exercise only as directed by your caregiver. Experiencing uterine cramps is a good sign to stop exercising.  Continue to eat regular, healthy meals.  Wear a good support bra for breast tenderness.  Do not use hot tubs, steam rooms, or saunas.  Wear your seat belt at all times when driving.  Avoid raw meat, uncooked cheese, cat litter boxes, and soil used by cats. These carry germs that can cause birth defects in the baby.  Take your prenatal vitamins.  Try taking a stool softener (if your caregiver approves) if you develop constipation. Eat more high-fiber foods, such as fresh vegetables or fruit and whole grains. Drink plenty of fluids to keep your urine clear or pale yellow.  Take warm sitz baths to soothe any pain or discomfort caused by hemorrhoids. Use hemorrhoid cream if your caregiver approves.  If you develop varicose veins, wear support hose. Elevate your feet for 15 minutes, 3-4 times a day. Limit salt in your diet.  Avoid heavy lifting, wear low heal shoes, and practice good posture.  Rest a lot with your legs elevated if you have leg cramps or low back pain.  Visit your dentist if you have not gone during your pregnancy. Use a soft toothbrush to brush your teeth and be gentle when you floss.  A sexual relationship may be continued unless your caregiver directs you otherwise.  Do not travel far distances unless it is absolutely necessary and only with the approval  of your caregiver.  Take prenatal classes to understand, practice, and ask questions about the labor and delivery.  Make a trial run to the hospital.  Pack your hospital bag.  Prepare the baby's nursery.  Continue to go to all your prenatal visits as directed by your caregiver. SEEK MEDICAL CARE IF:  You are unsure if you are in labor or if your water has broken.  You have dizziness.  You have mild pelvic cramps, pelvic pressure, or nagging pain in your abdominal area.  You have persistent nausea, vomiting, or diarrhea.  You have a bad smelling vaginal discharge.  You have pain with urination. SEEK IMMEDIATE MEDICAL CARE IF:   You have a fever.  You are leaking fluid from your vagina.  You have spotting or bleeding from your vagina.  You have severe abdominal cramping or pain.  You have rapid weight loss or gain.  You have shortness of breath with chest pain.  You notice sudden or extreme swelling   of your face, hands, ankles, feet, or legs.  You have not felt your baby move in over an hour.  You have severe headaches that do not go away with medicine.  You have vision changes. Document Released: 06/05/2001 Document Revised: 06/16/2013 Document Reviewed: 08/12/2012 Exit[employer] Patient Information 2015 [employer]. This information is not intended to replace advice given to you by your health care provider. Make sure you discuss any questions you have with your health care provider.  

## 2014-03-29 NOTE — Progress Notes (Signed)
Doing well. Urine culture was neg. Reviewed S/sx labor and FM awareness. Plans breastfeed, outside circ, unsure contraception. Encouraged LARC.  Cultures done

## 2014-03-30 LAB — GC/CHLAMYDIA PROBE AMP
CT Probe RNA: NEGATIVE
GC Probe RNA: NEGATIVE

## 2014-04-01 LAB — CULTURE, BETA STREP (GROUP B ONLY)

## 2014-04-05 ENCOUNTER — Ambulatory Visit (INDEPENDENT_AMBULATORY_CARE_PROVIDER_SITE_OTHER): Payer: Medicaid Other | Admitting: Obstetrics and Gynecology

## 2014-04-05 ENCOUNTER — Encounter: Payer: Self-pay | Admitting: Obstetrics and Gynecology

## 2014-04-05 VITALS — BP 126/60 | HR 94 | Temp 98.3°F | Wt 206.9 lb

## 2014-04-05 DIAGNOSIS — Z3403 Encounter for supervision of normal first pregnancy, third trimester: Secondary | ICD-10-CM

## 2014-04-05 LAB — POCT URINALYSIS DIP (DEVICE)
Bilirubin Urine: NEGATIVE
GLUCOSE, UA: NEGATIVE mg/dL
HGB URINE DIPSTICK: NEGATIVE
Ketones, ur: NEGATIVE mg/dL
Nitrite: NEGATIVE
PH: 7 (ref 5.0–8.0)
PROTEIN: NEGATIVE mg/dL
Specific Gravity, Urine: 1.02 (ref 1.005–1.030)
UROBILINOGEN UA: 1 mg/dL (ref 0.0–1.0)

## 2014-04-05 NOTE — Progress Notes (Signed)
Doing well today. No concerns or complaints. FM/Labor precautions reviewed.

## 2014-04-12 ENCOUNTER — Encounter: Payer: Self-pay | Admitting: Obstetrics and Gynecology

## 2014-04-12 ENCOUNTER — Ambulatory Visit (INDEPENDENT_AMBULATORY_CARE_PROVIDER_SITE_OTHER): Payer: Medicaid Other | Admitting: Obstetrics and Gynecology

## 2014-04-12 VITALS — BP 127/53 | HR 86 | Wt 209.6 lb

## 2014-04-12 DIAGNOSIS — Z3403 Encounter for supervision of normal first pregnancy, third trimester: Secondary | ICD-10-CM

## 2014-04-12 DIAGNOSIS — E669 Obesity, unspecified: Secondary | ICD-10-CM

## 2014-04-12 DIAGNOSIS — O0933 Supervision of pregnancy with insufficient antenatal care, third trimester: Secondary | ICD-10-CM

## 2014-04-12 LAB — POCT URINALYSIS DIP (DEVICE)
BILIRUBIN URINE: NEGATIVE
Glucose, UA: NEGATIVE mg/dL
Hgb urine dipstick: NEGATIVE
Ketones, ur: NEGATIVE mg/dL
NITRITE: NEGATIVE
PH: 7 (ref 5.0–8.0)
PROTEIN: 30 mg/dL — AB
Specific Gravity, Urine: 1.025 (ref 1.005–1.030)
Urobilinogen, UA: 1 mg/dL (ref 0.0–1.0)

## 2014-04-12 NOTE — Progress Notes (Signed)
Patient is doing well without complaints. FM/labor precautions reviewed 

## 2014-04-15 ENCOUNTER — Encounter (HOSPITAL_COMMUNITY): Payer: Self-pay | Admitting: *Deleted

## 2014-04-15 ENCOUNTER — Inpatient Hospital Stay (HOSPITAL_COMMUNITY)
Admission: AD | Admit: 2014-04-15 | Discharge: 2014-04-15 | Disposition: A | Discharge status: Home / Self Care | Payer: Medicaid Other | Source: Ambulatory Visit | Attending: Obstetrics & Gynecology | Admitting: Obstetrics & Gynecology

## 2014-04-15 DIAGNOSIS — O0933 Supervision of pregnancy with insufficient antenatal care, third trimester: Secondary | ICD-10-CM

## 2014-04-15 DIAGNOSIS — O471 False labor at or after 37 completed weeks of gestation: Secondary | ICD-10-CM | POA: Insufficient documentation

## 2014-04-15 DIAGNOSIS — Z3A39 39 weeks gestation of pregnancy: Secondary | ICD-10-CM | POA: Diagnosis not present

## 2014-04-15 DIAGNOSIS — Z3403 Encounter for supervision of normal first pregnancy, third trimester: Secondary | ICD-10-CM

## 2014-04-15 HISTORY — DX: Gastro-esophageal reflux disease without esophagitis: K21.9

## 2014-04-15 HISTORY — DX: Unspecified abnormal cytological findings in specimens from vagina: R87.629

## 2014-04-15 NOTE — MAU Note (Signed)
Pt feeling contractions all in her low back since 1700 and says they are getting stronger.  Denies any vag bleeding or leaking.

## 2014-04-18 ENCOUNTER — Inpatient Hospital Stay (HOSPITAL_COMMUNITY)
Admission: AD | Admit: 2014-04-18 | Discharge: 2014-04-18 | Disposition: A | Discharge status: Home / Self Care | Payer: Medicaid Other | Source: Ambulatory Visit | Attending: Obstetrics & Gynecology | Admitting: Obstetrics & Gynecology

## 2014-04-18 ENCOUNTER — Encounter (HOSPITAL_COMMUNITY): Payer: Self-pay | Admitting: *Deleted

## 2014-04-18 DIAGNOSIS — Z3A40 40 weeks gestation of pregnancy: Secondary | ICD-10-CM | POA: Diagnosis not present

## 2014-04-18 DIAGNOSIS — O471 False labor at or after 37 completed weeks of gestation: Secondary | ICD-10-CM | POA: Diagnosis not present

## 2014-04-18 DIAGNOSIS — O0933 Supervision of pregnancy with insufficient antenatal care, third trimester: Secondary | ICD-10-CM

## 2014-04-18 DIAGNOSIS — Z3403 Encounter for supervision of normal first pregnancy, third trimester: Secondary | ICD-10-CM

## 2014-04-18 NOTE — Discharge Instructions (Signed)
Braxton Hicks Contractions Contractions of the uterus can occur throughout pregnancy. Contractions are not always a sign that you are in labor.  WHAT ARE BRAXTON HICKS CONTRACTIONS?  Contractions that occur before labor are called Braxton Hicks contractions, or false labor. Toward the end of pregnancy (32-34 weeks), these contractions can develop more often and may become more forceful. This is not true labor because these contractions do not result in opening (dilatation) and thinning of the cervix. They are sometimes difficult to tell apart from true labor because these contractions can be forceful and people have different pain tolerances. You should not feel embarrassed if you go to the hospital with false labor. Sometimes, the only way to tell if you are in true labor is for your health care provider to look for changes in the cervix. If there are no prenatal problems or other health problems associated with the pregnancy, it is completely safe to be sent home with false labor and await the onset of true labor. HOW CAN YOU TELL THE DIFFERENCE BETWEEN TRUE AND FALSE LABOR? False Labor  The contractions of false labor are usually shorter and not as hard as those of true labor.   The contractions are usually irregular.   The contractions are often felt in the front of the lower abdomen and in the groin.   The contractions may go away when you walk around or change positions while lying down.   The contractions get weaker and are shorter lasting as time goes on.   The contractions do not usually become progressively stronger, regular, and closer together as with true labor.  True Labor  Contractions in true labor last 30-70 seconds, become very regular, usually become more intense, and increase in frequency.   The contractions do not go away with walking.   The discomfort is usually felt in the top of the uterus and spreads to the lower abdomen and low back.   True labor can be  determined by your health care provider with an exam. This will show that the cervix is dilating and getting thinner.  WHAT TO REMEMBER  Keep up with your usual exercises and follow other instructions given by your health care provider.   Take medicines as directed by your health care provider.   Keep your regular prenatal appointments.   Eat and drink lightly if you think you are going into labor.   If Braxton Hicks contractions are making you uncomfortable:   Change your position from lying down or resting to walking, or from walking to resting.   Sit and rest in a tub of warm water.   Drink 2-3 glasses of water. Dehydration may cause these contractions.   Do slow and deep breathing several times an hour.  WHEN SHOULD I SEEK IMMEDIATE MEDICAL CARE? Seek immediate medical care if:  Your contractions become stronger, more regular, and closer together.   You have fluid leaking or gushing from your vagina.   You have a fever.   You pass blood-tinged mucus.   You have vaginal bleeding.   You have continuous abdominal pain.   You have low back pain that you never had before.   You feel your baby's head pushing down and causing pelvic pressure.   Your baby is not moving as much as it used to.  Document Released: 06/11/2005 Document Revised: 06/16/2013 Document Reviewed: 03/23/2013 Exit[employer] Patient Information 2015 [employer]. This information is not intended to replace advice given to you by your health care   provider. Make sure you discuss any questions you have with your health care provider.  Fetal Movement Counts Patient Name: __________________________________________________ Patient Due Date: ____________________ Performing a fetal movement count is highly recommended in high-risk pregnancies, but it is good for every pregnant woman to do. Your health care provider may ask you to start counting fetal movements at 28 weeks of the pregnancy. Fetal  movements often increase:  After eating a full meal.  After physical activity.  After eating or drinking something sweet or cold.  At rest. Pay attention to when you feel the baby is most active. This will help you notice a pattern of your baby's sleep and wake cycles and what factors contribute to an increase in fetal movement. It is important to perform a fetal movement count at the same time each day when your baby is normally most active.  HOW TO COUNT FETAL MOVEMENTS 1. Find a quiet and comfortable area to sit or lie down on your left side. Lying on your left side provides the best blood and oxygen circulation to your baby. 2. Write down the day and time on a sheet of paper or in a journal. 3. Start counting kicks, flutters, swishes, rolls, or jabs in a 2-hour period. You should feel at least 10 movements within 2 hours. 4. If you do not feel 10 movements in 2 hours, wait 2-3 hours and count again. Look for a change in the pattern or not enough counts in 2 hours. SEEK MEDICAL CARE IF:  You feel less than 10 counts in 2 hours, tried twice.  There is no movement in over an hour.  The pattern is changing or taking longer each day to reach 10 counts in 2 hours.  You feel the baby is not moving as he or she usually does. Date: ____________ Movements: ____________ Start time: ____________ Finish time: ____________  Date: ____________ Movements: ____________ Start time: ____________ Finish time: ____________ Date: ____________ Movements: ____________ Start time: ____________ Finish time: ____________ Date: ____________ Movements: ____________ Start time: ____________ Finish time: ____________ Date: ____________ Movements: ____________ Start time: ____________ Finish time: ____________ Date: ____________ Movements: ____________ Start time: ____________ Finish time: ____________ Date: ____________ Movements: ____________ Start time: ____________ Finish time: ____________ Date: ____________  Movements: ____________ Start time: ____________ Finish time: ____________  Date: ____________ Movements: ____________ Start time: ____________ Finish time: ____________ Date: ____________ Movements: ____________ Start time: ____________ Finish time: ____________ Date: ____________ Movements: ____________ Start time: ____________ Finish time: ____________ Date: ____________ Movements: ____________ Start time: ____________ Finish time: ____________ Date: ____________ Movements: ____________ Start time: ____________ Finish time: ____________ Date: ____________ Movements: ____________ Start time: ____________ Finish time: ____________ Date: ____________ Movements: ____________ Start time: ____________ Finish time: ____________  Date: ____________ Movements: ____________ Start time: ____________ Finish time: ____________ Date: ____________ Movements: ____________ Start time: ____________ Finish time: ____________ Date: ____________ Movements: ____________ Start time: ____________ Finish time: ____________ Date: ____________ Movements: ____________ Start time: ____________ Finish time: ____________ Date: ____________ Movements: ____________ Start time: ____________ Finish time: ____________ Date: ____________ Movements: ____________ Start time: ____________ Finish time: ____________ Date: ____________ Movements: ____________ Start time: ____________ Finish time: ____________  Date: ____________ Movements: ____________ Start time: ____________ Finish time: ____________ Date: ____________ Movements: ____________ Start time: ____________ Finish time: ____________ Date: ____________ Movements: ____________ Start time: ____________ Finish time: ____________ Date: ____________ Movements: ____________ Start time: ____________ Finish time: ____________ Date: ____________ Movements: ____________ Start time: ____________ Finish time: ____________ Date: ____________ Movements: ____________ Start time:  ____________ Finish time: ____________ Date: ____________ Movements:   ____________ Start time: ____________ Finish time: ____________  Date: ____________ Movements: ____________ Start time: ____________ Finish time: ____________ Date: ____________ Movements: ____________ Start time: ____________ Finish time: ____________ Date: ____________ Movements: ____________ Start time: ____________ Finish time: ____________ Date: ____________ Movements: ____________ Start time: ____________ Finish time: ____________ Date: ____________ Movements: ____________ Start time: ____________ Finish time: ____________ Date: ____________ Movements: ____________ Start time: ____________ Finish time: ____________ Date: ____________ Movements: ____________ Start time: ____________ Finish time: ____________  Date: ____________ Movements: ____________ Start time: ____________ Finish time: ____________ Date: ____________ Movements: ____________ Start time: ____________ Finish time: ____________ Date: ____________ Movements: ____________ Start time: ____________ Finish time: ____________ Date: ____________ Movements: ____________ Start time: ____________ Finish time: ____________ Date: ____________ Movements: ____________ Start time: ____________ Finish time: ____________ Date: ____________ Movements: ____________ Start time: ____________ Finish time: ____________ Date: ____________ Movements: ____________ Start time: ____________ Finish time: ____________  Date: ____________ Movements: ____________ Start time: ____________ Finish time: ____________ Date: ____________ Movements: ____________ Start time: ____________ Finish time: ____________ Date: ____________ Movements: ____________ Start time: ____________ Finish time: ____________ Date: ____________ Movements: ____________ Start time: ____________ Finish time: ____________ Date: ____________ Movements: ____________ Start time: ____________ Finish time: ____________ Date:  ____________ Movements: ____________ Start time: ____________ Finish time: ____________ Date: ____________ Movements: ____________ Start time: ____________ Finish time: ____________  Date: ____________ Movements: ____________ Start time: ____________ Finish time: ____________ Date: ____________ Movements: ____________ Start time: ____________ Finish time: ____________ Date: ____________ Movements: ____________ Start time: ____________ Finish time: ____________ Date: ____________ Movements: ____________ Start time: ____________ Finish time: ____________ Date: ____________ Movements: ____________ Start time: ____________ Finish time: ____________ Date: ____________ Movements: ____________ Start time: ____________ Finish time: ____________ Document Released: 07/11/2006 Document Revised: 10/26/2013 Document Reviewed: 04/07/2012 Exit[employer] Patient Information 2015 [employer]. This information is not intended to replace advice given to you by your health care provider. Make sure you discuss any questions you have with your health care provider.  

## 2014-04-18 NOTE — MAU Note (Signed)
Pt presents to MAU with complaints of contractions that started at 330 today. Denies any vaginal bleeding or LOF

## 2014-04-19 ENCOUNTER — Encounter (HOSPITAL_COMMUNITY): Payer: Self-pay | Admitting: *Deleted

## 2014-04-19 ENCOUNTER — Encounter (HOSPITAL_COMMUNITY): Payer: Medicaid Other | Admitting: Anesthesiology

## 2014-04-19 ENCOUNTER — Inpatient Hospital Stay (HOSPITAL_COMMUNITY)
Admission: AD | Admit: 2014-04-19 | Discharge: 2014-04-22 | DRG: 775 | Disposition: A | Discharge status: Home / Self Care | Payer: Medicaid Other | Source: Ambulatory Visit | Attending: Family Medicine | Admitting: Family Medicine

## 2014-04-19 ENCOUNTER — Inpatient Hospital Stay (HOSPITAL_COMMUNITY): Payer: Medicaid Other | Admitting: Anesthesiology

## 2014-04-19 ENCOUNTER — Encounter: Payer: Medicaid Other | Admitting: Obstetrics and Gynecology

## 2014-04-19 DIAGNOSIS — O42119 Preterm premature rupture of membranes, onset of labor more than 24 hours following rupture, unspecified trimester: Secondary | ICD-10-CM

## 2014-04-19 DIAGNOSIS — Z3A40 40 weeks gestation of pregnancy: Secondary | ICD-10-CM | POA: Diagnosis present

## 2014-04-19 DIAGNOSIS — O4212 Full-term premature rupture of membranes, onset of labor more than 24 hours following rupture: Secondary | ICD-10-CM | POA: Diagnosis present

## 2014-04-19 DIAGNOSIS — Z3403 Encounter for supervision of normal first pregnancy, third trimester: Secondary | ICD-10-CM

## 2014-04-19 DIAGNOSIS — O0933 Supervision of pregnancy with insufficient antenatal care, third trimester: Secondary | ICD-10-CM

## 2014-04-19 LAB — CBC
HCT: 32.8 % — ABNORMAL LOW (ref 36.0–46.0)
Hemoglobin: 11 g/dL — ABNORMAL LOW (ref 12.0–15.0)
MCH: 27.8 pg (ref 26.0–34.0)
MCHC: 33.5 g/dL (ref 30.0–36.0)
MCV: 82.8 fL (ref 78.0–100.0)
PLATELETS: 148 10*3/uL — AB (ref 150–400)
RBC: 3.96 MIL/uL (ref 3.87–5.11)
RDW: 13.8 % (ref 11.5–15.5)
WBC: 14.5 10*3/uL — AB (ref 4.0–10.5)

## 2014-04-19 LAB — HIV ANTIBODY (ROUTINE TESTING W REFLEX): HIV 1&2 Ab, 4th Generation: NONREACTIVE

## 2014-04-19 LAB — ABO/RH: ABO/RH(D): A POS

## 2014-04-19 LAB — POCT FERN TEST: POCT Fern Test: POSITIVE

## 2014-04-19 LAB — TYPE AND SCREEN
ABO/RH(D): A POS
Antibody Screen: NEGATIVE

## 2014-04-19 LAB — RPR

## 2014-04-19 MED ORDER — FENTANYL 2.5 MCG/ML BUPIVACAINE 1/10 % EPIDURAL INFUSION (WH - ANES)
14.0000 mL/h | INTRAMUSCULAR | Status: DC | PRN
  Administered 2014-04-19 – 2014-04-20 (×5): 14 mL/h via EPIDURAL
  Filled 2014-04-19 (×5): qty 125

## 2014-04-19 MED ORDER — CITRIC ACID-SODIUM CITRATE 334-500 MG/5ML PO SOLN: 30.0000 mL | ORAL | Status: DC | PRN

## 2014-04-19 MED ORDER — PHENYLEPHRINE 40 MCG/ML (10ML) SYRINGE FOR IV PUSH (FOR BLOOD PRESSURE SUPPORT)
PREFILLED_SYRINGE | INTRAVENOUS | Status: AC
  Filled 2014-04-19: qty 10

## 2014-04-19 MED ORDER — EPHEDRINE 5 MG/ML INJ
INTRAVENOUS | Status: AC
  Filled 2014-04-19: qty 4

## 2014-04-19 MED ORDER — LIDOCAINE HCL (PF) 1 % IJ SOLN
30.0000 mL | INTRAMUSCULAR | Status: DC | PRN
  Filled 2014-04-19: qty 30

## 2014-04-19 MED ORDER — OXYCODONE-ACETAMINOPHEN 5-325 MG PO TABS: 1.0000 | ORAL_TABLET | ORAL | Status: DC | PRN

## 2014-04-19 MED ORDER — FENTANYL 2.5 MCG/ML BUPIVACAINE 1/10 % EPIDURAL INFUSION (WH - ANES)
INTRAMUSCULAR | Status: AC
  Administered 2014-04-19: 14 mL/h via EPIDURAL
  Filled 2014-04-19: qty 125

## 2014-04-19 MED ORDER — DIPHENHYDRAMINE HCL 50 MG/ML IJ SOLN
12.5000 mg | INTRAMUSCULAR | Status: AC | PRN
  Administered 2014-04-19 (×3): 12.5 mg via INTRAVENOUS
  Filled 2014-04-19: qty 1

## 2014-04-19 MED ORDER — FENTANYL 2.5 MCG/ML BUPIVACAINE 1/10 % EPIDURAL INFUSION (WH - ANES)
INTRAMUSCULAR | Status: DC | PRN
  Administered 2014-04-19: 14 mL/h via EPIDURAL

## 2014-04-19 MED ORDER — EPHEDRINE 5 MG/ML INJ
10.0000 mg | INTRAVENOUS | Status: DC | PRN
  Filled 2014-04-19: qty 2

## 2014-04-19 MED ORDER — ONDANSETRON HCL 4 MG/2ML IJ SOLN: 4.0000 mg | Freq: Four times a day (QID) | INTRAMUSCULAR | Status: DC | PRN

## 2014-04-19 MED ORDER — OXYTOCIN BOLUS FROM INFUSION: 500.0000 mL | INTRAVENOUS | Status: DC

## 2014-04-19 MED ORDER — PHENYLEPHRINE 40 MCG/ML (10ML) SYRINGE FOR IV PUSH (FOR BLOOD PRESSURE SUPPORT)
80.0000 ug | PREFILLED_SYRINGE | INTRAVENOUS | Status: DC | PRN
  Filled 2014-04-19: qty 2

## 2014-04-19 MED ORDER — LACTATED RINGERS IV SOLN: 500.0000 mL | INTRAVENOUS | Status: DC | PRN

## 2014-04-19 MED ORDER — TERBUTALINE SULFATE 1 MG/ML IJ SOLN: 0.2500 mg | Freq: Once | INTRAMUSCULAR | Status: AC | PRN

## 2014-04-19 MED ORDER — ACETAMINOPHEN 325 MG PO TABS: 650.0000 mg | ORAL_TABLET | ORAL | Status: DC | PRN

## 2014-04-19 MED ORDER — FENTANYL CITRATE 0.05 MG/ML IJ SOLN
INTRAMUSCULAR | Status: AC
  Filled 2014-04-19: qty 2

## 2014-04-19 MED ORDER — LACTATED RINGERS IV SOLN
INTRAVENOUS | Status: DC
  Administered 2014-04-19 – 2014-04-20 (×3): via INTRAVENOUS

## 2014-04-19 MED ORDER — SODIUM BICARBONATE 8.4 % IV SOLN
INTRAVENOUS | Status: DC | PRN
  Administered 2014-04-19 (×2): 5 mL via EPIDURAL

## 2014-04-19 MED ORDER — BUPIVACAINE HCL (PF) 0.25 % IJ SOLN
INTRAMUSCULAR | Status: DC | PRN
  Administered 2014-04-19: 8 mL via EPIDURAL

## 2014-04-19 MED ORDER — LIDOCAINE HCL (PF) 1 % IJ SOLN
INTRAMUSCULAR | Status: DC | PRN
  Administered 2014-04-19 (×4): 4 mL

## 2014-04-19 MED ORDER — PHENYLEPHRINE 40 MCG/ML (10ML) SYRINGE FOR IV PUSH (FOR BLOOD PRESSURE SUPPORT)
80.0000 ug | PREFILLED_SYRINGE | INTRAVENOUS | Status: DC | PRN
  Filled 2014-04-19: qty 2
  Filled 2014-04-19: qty 10

## 2014-04-19 MED ORDER — OXYCODONE-ACETAMINOPHEN 5-325 MG PO TABS: 2.0000 | ORAL_TABLET | ORAL | Status: DC | PRN

## 2014-04-19 MED ORDER — OXYTOCIN 40 UNITS IN LACTATED RINGERS INFUSION - SIMPLE MED
62.5000 mL/h | INTRAVENOUS | Status: DC
  Filled 2014-04-19: qty 1000

## 2014-04-19 MED ORDER — LACTATED RINGERS IV SOLN
500.0000 mL | Freq: Once | INTRAVENOUS | Status: AC
  Administered 2014-04-19: 500 mL via INTRAVENOUS

## 2014-04-19 MED ORDER — FENTANYL CITRATE 0.05 MG/ML IJ SOLN
INTRAMUSCULAR | Status: DC | PRN
  Administered 2014-04-19: 100 ug via EPIDURAL

## 2014-04-19 MED ORDER — OXYTOCIN 40 UNITS IN LACTATED RINGERS INFUSION - SIMPLE MED
1.0000 m[IU]/min | INTRAVENOUS | Status: DC
  Administered 2014-04-19: 2 m[IU]/min via INTRAVENOUS
  Filled 2014-04-19: qty 1000

## 2014-04-19 NOTE — Anesthesia Procedure Notes (Addendum)
Epidural Patient location during procedure: OB Start time: 04/19/2014 7:43 AM  Staffing Anesthesiologist: Cambri Plourde A. Performed by: anesthesiologist   Preanesthetic Checklist Completed: patient identified, site marked, surgical consent, pre-op evaluation, timeout performed, IV checked, risks and benefits discussed and monitors and equipment checked  Epidural Patient position: sitting Prep: site prepped and draped and DuraPrep Patient monitoring: continuous pulse ox and blood pressure Approach: midline Location: L4-L5 Injection technique: LOR air  Needle:  Needle type: Tuohy  Needle gauge: 17 G Needle length: 9 cm and 9 Needle insertion depth: 6 cm Catheter type: closed end flexible Catheter size: 19 Gauge Catheter at skin depth: 11 cm Test dose: negative and Other  Assessment Events: blood not aspirated, injection not painful, no injection resistance, negative IV test and no paresthesia  Additional Notes Patient identified. Risks and benefits discussed including failed block, incomplete  Pain control, post dural puncture headache, nerve damage, paralysis, blood pressure Changes, nausea, vomiting, reactions to medications-both toxic and allergic and post Partum back pain. All questions were answered. Patient expressed understanding and wished to proceed. Sterile technique was used throughout procedure. Epidural site was Dressed with sterile barrier dressing. No paresthesias, signs of intravascular injection Or signs of intrathecal spread were encountered.  Patient was more comfortable after the epidural was dosed. Please see RN's note for documentation of vital signs and FHR which are stable.   Epidural Patient location during procedure: OB Start time: 04/19/2014 5:29 PM  Staffing Anesthesiologist: Windel Keziah A. Performed by: anesthesiologist   Preanesthetic Checklist Completed: patient identified, site marked, surgical consent, pre-op evaluation,  timeout performed, IV checked, risks and benefits discussed and monitors and equipment checked  Epidural Patient position: sitting Prep: site prepped and draped and DuraPrep Patient monitoring: continuous pulse ox and blood pressure Approach: midline Location: L3-L4 Injection technique: LOR air  Needle:  Needle type: Tuohy  Needle gauge: 17 G Needle length: 9 cm and 9 Needle insertion depth: 6 cm Catheter type: closed end flexible Catheter size: 19 Gauge Catheter at skin depth: 11 cm Test dose: negative and Other  Assessment Events: blood not aspirated, injection not painful, no injection resistance, negative IV test and no paresthesia  Additional Notes Patient identified. Risks and benefits discussed including failed block, incomplete  Pain control, post dural puncture headache, nerve damage, paralysis, blood pressure Changes, nausea, vomiting, reactions to medications-both toxic and allergic and post Partum back pain. All questions were answered. Patient expressed understanding and wished to proceed. Sterile technique was used throughout procedure. Epidural site was Dressed with sterile barrier dressing. No paresthesias, signs of intravascular injection Or signs of intrathecal spread were encountered.  Patient was more comfortable after the epidural was dosed. Please see RN's note for documentation of vital signs and FHR which are stable.

## 2014-04-19 NOTE — Anesthesia Preprocedure Evaluation (Addendum)
Anesthesia Evaluation  Patient identified by MRN, date of birth, ID band Patient awake    Reviewed: Allergy & Precautions, H&P , NPO status , Patient's Chart, lab work & pertinent test results  Airway Mallampati: III TM Distance: >3 FB Neck ROM: Full    Dental no notable dental hx. (+) Teeth Intact   Pulmonary former smoker,  breath sounds clear to auscultation- rhonchi  Pulmonary exam normal       Cardiovascular Rhythm:Regular Rate:Normal     Neuro/Psych negative neurological ROS  negative psych ROS   GI/Hepatic Neg liver ROS, GERD-  Medicated and Controlled,  Endo/Other  Morbid obesityHyperlipidemia  Renal/GU negative Renal ROS  negative genitourinary   Musculoskeletal negative musculoskeletal ROS (+)   Abdominal (+) + obese,   Peds  Hematology  (+) anemia ,   Anesthesia Other Findings   Reproductive/Obstetrics (+) Pregnancy                          Anesthesia Physical Anesthesia Plan  ASA: II  Anesthesia Plan: Epidural   Post-op Pain Management:    Induction:   Airway Management Planned: Natural Airway  Additional Equipment:   Intra-op Plan:   Post-operative Plan:   Informed Consent: I have reviewed the patients History and Physical, chart, labs and discussed the procedure including the risks, benefits and alternatives for the proposed anesthesia with the patient or authorized representative who has indicated his/her understanding and acceptance.     Plan Discussed with: Anesthesiologist  Anesthesia Plan Comments:         Anesthesia Quick Evaluation

## 2014-04-19 NOTE — Progress Notes (Signed)
Patient in room. BS nurse in room.

## 2014-04-19 NOTE — H&P (Signed)
I examined pt and agree with documentation above and resident plan of care. Walidah N Karim, CNM  

## 2014-04-19 NOTE — Progress Notes (Signed)
   Subjective: Pt reports continued pain with epidural.  Considering epidural redo.  Objective: BP 129/63  Pulse 110  Temp(Src) 98.2 F (36.8 C) (Oral)  Resp 18  Ht 5\' 3"  (1.6 m)  Wt 94.802 kg (209 lb)  BMI 37.03 kg/m2  SpO2 100%  LMP 06/12/2013      FHT:  FHR: 140's bpm, variability: moderate,  accelerations:  Abscent,  decelerations:  Absent UC:   regular, every 3-3.5 minutes SVE:   Dilation: 5 Effacement (%): 90 Station: -1 Exam by:: Baxter InternationalW Muhammad CNM  Labs: Lab Results  Component Value Date   WBC 14.5* 04/19/2014   HGB 11.0* 04/19/2014   HCT 32.8* 04/19/2014   MCV 82.8 04/19/2014   PLT 148* 04/19/2014    Assessment / Plan: 23 yo G1P0 at 2025w0d weeks IUP SROM Category I FHR Tracing  Labor: Progressing normally Preeclampsia:  n/a Fetal Wellbeing:  Category I Pain Control:  Epidural I/D:  GBS neg Anticipated MOD:  NSVD  KARIM, Merton Wadlow N 04/19/2014, 12:06 PM

## 2014-04-19 NOTE — Progress Notes (Signed)
Patient denies dizziness or ringing in her ears. States she feels fine

## 2014-04-19 NOTE — Progress Notes (Signed)
   Subjective: Pt reports continued pain in back with contractions with epidural.    Objective: BP 138/51  Pulse 104  Temp(Src) 98.2 F (36.8 C) (Oral)  Resp 16  Ht 5\' 3"  (1.6 m)  Wt 94.802 kg (209 lb)  BMI 37.03 kg/m2  SpO2 100%  LMP 06/12/2013      FHT:  FHR: 140's bpm, variability: marked,  accelerations:  Present,  decelerations:  Present intermittent variable decels with return to baseline. UC:   regular, every 2.5-3 minutes SVE:   Dilation: 6 Effacement (%): 100 Station: -1 Exam by:: Amy Beard RNC-OB  Labs: Lab Results  Component Value Date   WBC 14.5* 04/19/2014   HGB 11.0* 04/19/2014   HCT 32.8* 04/19/2014   MCV 82.8 04/19/2014   PLT 148* 04/19/2014    Assessment / Plan: Spontaneous labor, progressing normally  Labor: Progressing normally Preeclampsia:  n/a Fetal Wellbeing:  Category II Pain Control:  Epidural I/D:  GBS neg Anticipated MOD:  NSVD  KARIM, Yaasir Menken N 04/19/2014, 10:10 AM

## 2014-04-19 NOTE — H&P (Signed)
Angela MoleKanika Z Cortinas is a 23 y.o. female G1P0 with IUP at 3944w0d presenting in active labor. Pt states she has been having regular, every 3-5 minutes contractions, associated with no vaginal bleeding.  Membranes are intact, ruptured, around 0530 this morning. Endorses active fetal movement.   [facility]  [facility]ren[facility] History/Complications:   [facility]  Dating [facility] week ultrasound  Genetic Screen Quad: negative                   Anatomic US Normal female  GTT Early:  6579             Third trimester: 104  TDaP vaccine  03/03/14  Flu vaccine  03/03/14  GBS Neg  Contraception OCP vs Mirena  Baby Food Breast  Circumcision OP circ  Pediatrician Undecided  Support Person       Past Medical History: Past Medical History  Diagnosis Date  . Medical history non-contributory   . Vaginal Pap smear, abnormal   . Infection     BV  . GERD (gastroesophageal reflux disease)     Past Surgical History: Past Surgical History  Procedure Laterality Date  . No past surgeries      Obstetrical History: OB History   Grav Para Term Preterm Abortions TAB SAB Ect Mult Living   1                Social History: History   Social History  . Marital Status: Single    Spouse Name: N/A    Number of Children: N/A  . Years of Education: N/A   Social History Main Topics  . Smoking status: Former Games developermoker  . Smokeless tobacco: Never Used  . Alcohol Use: No     Comment: when not pregnant  . Drug Use: No  . Sexual Activity: Yes    Birth Control/ Protection: None   Other Topics Concern  . Not on file   Social History Narrative   ** Merged History Encounter **        Family History: Family History  Problem Relation Age of Onset  . Parkinson's disease Maternal Grandmother   . Dementia Maternal Grandmother     Allergies: No Known Allergies  Prescriptions prior to admission  Medication Sig Dispense Refill  . Prenatal Multivit-Min-Fe-FA (PRENATAL VITAMINS) 0.8 MG tablet Take 1 tablet  by mouth daily.  30 tablet  12     Review of Systems - As per HPI, otherwise all systems reviewed and are negative.  Last menstrual period 06/12/2013. General appearance: alert, cooperative and appears stated age Lungs: clear to auscultation bilaterally Heart: regular rate and rhythm Abdomen: soft, non-tender; bowel sounds normal Extremities: Homans sign is negative, no sign of DVT DTR's wnl Presentation: cephalic Fetal monitoringBaseline: 145 bpm, Variability: Good {> 6 bpm), Accelerations: Reactive and Decelerations: Absent Uterine activityFrequency: Every 2-4 minutes Dilation: 3.5 Effacement (%): 90 Station: -1 Exam by:: Judie Petitunbar RN    Prenatal labs: ABO, Rh: A/POS/-- (06/29 1044) Antibody: NEG (06/29 1044) Rubella:   RPR: NON REAC (08/11 1206)  HBsAg: NEGATIVE (06/29 1044)  HIV: NONREACTIVE (08/11 1206)  GBS: Negative (10/05 0000)  1 hr Glucola 79 Genetic screening  Normal Anatomy US Normal   Prenatal Transfer Tool  Maternal Diabetes: No Genetic Screening: Normal Maternal Ultrasounds/Referrals: Normal Fetal Ultrasounds or other Referrals:  None Maternal Substance Abuse:  No Significant Maternal Medications:  None Significant Maternal Lab Results: None     Results for orders placed during the  hospital encounter of 04/19/14 (from the past 24 hour(s))  POCT FERN TEST   Collection Time    04/19/14  6:00 AM      Result Value Ref Range   POCT Fern Test Positive = ruptured amniotic membanes      Assessment: Angela Lynch is a 23 y.o. G1P0 at 5826w0d by 16wk US here  in active labor with SROM #Labor:SOL, SROM, expectant management #Pain: Analgesia and anesesthesia prn #FWB: Category 1 #ID:  GBS negative #MOF: [place] #MOC:Mirena vs OCP #Circ:  Outpatient  Jacquiline Doearker, Caleb 04/19/2014, 6:32 AM

## 2014-04-19 NOTE — Progress Notes (Signed)
Patient continues to deny any symptoms of hypotension. Resting quietly

## 2014-04-20 DIAGNOSIS — Z3A40 40 weeks gestation of pregnancy: Secondary | ICD-10-CM

## 2014-04-20 LAB — CBC
HCT: 28.6 % — ABNORMAL LOW (ref 36.0–46.0)
HEMOGLOBIN: 9.7 g/dL — AB (ref 12.0–15.0)
MCH: 27.7 pg (ref 26.0–34.0)
MCHC: 33.9 g/dL (ref 30.0–36.0)
MCV: 81.7 fL (ref 78.0–100.0)
PLATELETS: 150 10*3/uL (ref 150–400)
RBC: 3.5 MIL/uL — AB (ref 3.87–5.11)
RDW: 13.9 % (ref 11.5–15.5)
WBC: 38 10*3/uL — AB (ref 4.0–10.5)

## 2014-04-20 MED ORDER — SENNOSIDES-DOCUSATE SODIUM 8.6-50 MG PO TABS
2.0000 | ORAL_TABLET | ORAL | Status: DC
  Administered 2014-04-20 – 2014-04-22 (×2): 2 via ORAL
  Filled 2014-04-20 (×2): qty 2

## 2014-04-20 MED ORDER — SIMETHICONE 80 MG PO CHEW: 80.0000 mg | CHEWABLE_TABLET | ORAL | Status: DC | PRN

## 2014-04-20 MED ORDER — WITCH HAZEL-GLYCERIN EX PADS: 1.0000 "application " | MEDICATED_PAD | CUTANEOUS | Status: DC | PRN

## 2014-04-20 MED ORDER — ZOLPIDEM TARTRATE 5 MG PO TABS: 5.0000 mg | ORAL_TABLET | Freq: Every evening | ORAL | Status: DC | PRN

## 2014-04-20 MED ORDER — ONDANSETRON HCL 4 MG PO TABS: 4.0000 mg | ORAL_TABLET | ORAL | Status: DC | PRN

## 2014-04-20 MED ORDER — BENZOCAINE-MENTHOL 20-0.5 % EX AERO
1.0000 "application " | INHALATION_SPRAY | CUTANEOUS | Status: DC | PRN
  Administered 2014-04-20: 1 via TOPICAL
  Filled 2014-04-20: qty 56

## 2014-04-20 MED ORDER — IBUPROFEN 600 MG PO TABS
600.0000 mg | ORAL_TABLET | Freq: Four times a day (QID) | ORAL | Status: DC
  Administered 2014-04-20 – 2014-04-22 (×9): 600 mg via ORAL
  Filled 2014-04-20 (×9): qty 1

## 2014-04-20 MED ORDER — DIBUCAINE 1 % RE OINT: 1.0000 "application " | TOPICAL_OINTMENT | RECTAL | Status: DC | PRN

## 2014-04-20 MED ORDER — PRENATAL MULTIVITAMIN CH
1.0000 | ORAL_TABLET | Freq: Every day | ORAL | Status: DC
  Administered 2014-04-21 – 2014-04-22 (×2): 1 via ORAL
  Filled 2014-04-20 (×2): qty 1

## 2014-04-20 MED ORDER — TETANUS-DIPHTH-ACELL PERTUSSIS 5-2.5-18.5 LF-MCG/0.5 IM SUSP: 0.5000 mL | Freq: Once | INTRAMUSCULAR | Status: DC

## 2014-04-20 MED ORDER — DIPHENHYDRAMINE HCL 25 MG PO CAPS: 25.0000 mg | ORAL_CAPSULE | Freq: Four times a day (QID) | ORAL | Status: DC | PRN

## 2014-04-20 MED ORDER — ONDANSETRON HCL 4 MG/2ML IJ SOLN: 4.0000 mg | INTRAMUSCULAR | Status: DC | PRN

## 2014-04-20 MED ORDER — LANOLIN HYDROUS EX OINT: TOPICAL_OINTMENT | CUTANEOUS | Status: DC | PRN

## 2014-04-20 NOTE — Anesthesia Postprocedure Evaluation (Signed)
  Anesthesia Post-op Note  Patient: Angela Lynch  Procedure(s) Performed: * No procedures listed *  Patient Location: Mother/Baby  Anesthesia Type:Epidural  Level of Consciousness: awake  Airway and Oxygen Therapy: Patient Spontanous Breathing  Post-op Pain: mild  Post-op Assessment: Patient's Cardiovascular Status Stable and Respiratory Function Stable  Post-op Vital Signs: stable  Last Vitals:  Filed Vitals:   04/20/14 1229  BP: 124/59  Pulse: 104  Temp:   Resp:     Complications: No apparent anesthesia complications

## 2014-04-20 NOTE — Progress Notes (Signed)
UR completed 

## 2014-04-20 NOTE — Progress Notes (Signed)
Angela Lynch is a 23 y.o. G1P0 at 6355w1d by admitted for active labor, rupture of membranes  Subjective: Pushing, very uncomfortable.  Objective: BP 120/83  Pulse 129  Temp(Src) 99 F (37.2 C) (Axillary)  Resp 24  Ht 5\' 3"  (1.6 m)  Wt 209 lb (94.802 kg)  BMI 37.03 kg/m2  SpO2 100%  LMP 06/12/2013 I/O last 3 completed shifts: In: -  Out: 375 [Urine:375]    FHT:  FHR: 130 bpm, variability: moderate,  accelerations:  Present,  decelerations:  1 variable short UC:   q3-524min SVE:   Dilation: 10 Effacement (%): 100 Station: +1;+2 Exam by:: L.Stubbs, RN  Labs: Lab Results  Component Value Date   WBC 14.5* 04/19/2014   HGB 11.0* 04/19/2014   HCT 32.8* 04/19/2014   MCV 82.8 04/19/2014   PLT 148* 04/19/2014    Assessment / Plan: Spontaneous labor, progressing normally  Labor: slow progression to complete overnight, complete since 0500.  OP presentation confirmed on ultrasound, will try all-fours (pt agreeable) for at least 20minutes Preeclampsia:  no signs or symptoms of toxicity Fetal Wellbeing:  Category I Pain Control:  Epidural I/D:  n/a Anticipated MOD:  NSVD  Angela Lynch 04/20/2014, 9:37 AM

## 2014-04-20 NOTE — Progress Notes (Signed)
Valetta MoleKanika Z Vangorden is a 23 y.o. G1P0 at 8574w1d by ultrasound admitted for active labor, rupture of membranes  Subjective:   Objective: BP 125/71  Pulse 101  Temp(Src) 98.9 F (37.2 C) (Oral)  Resp 18  Ht 5\' 3"  (1.6 m)  Wt 94.802 kg (209 lb)  BMI 37.03 kg/m2  SpO2 99%  LMP 06/12/2013 I/O last 3 completed shifts: In: -  Out: 200 [Urine:200] Total I/O In: -  Out: 75 [Urine:75]  FHT:  FHR: 130 bpm, variability: moderate,  accelerations:  Present,  decelerations:  Absent UC:   regular, every 6 minutes SVE:   Dilation: Lip/rim Effacement (%): 100 Station: +1 Exam by:: Dr. Adriana Simasook  Labs: Lab Results  Component Value Date   WBC 14.5* 04/19/2014   HGB 11.0* 04/19/2014   HCT 32.8* 04/19/2014   MCV 82.8 04/19/2014   PLT 148* 04/19/2014    Assessment / Plan: Spontaneous labor, progressing normally  Labor: Progressing normally Preeclampsia:  no signs or symptoms of toxicity Fetal Wellbeing:  Category I Pain Control:  Epidural I/D:  n/a Anticipated MOD:  NSVD Pain moderately well controlled w epidural  Aldona BarKoch, Judi Jaffe L 04/20/2014, 3:02 AM

## 2014-04-21 NOTE — Lactation Note (Signed)
This note was copied from the chart of Boy Siri ColeKanika Shoaf. Lactation Consultation Note  Patient Name: Boy Siri ColeKanika Rodenberg NGEXB'MToday's Date: 04/21/2014 Reason for consult: Initial assessment;Difficult latch  Baby 26 hours of life. Mom reports baby having a hard time latching, keeps falling asleep. Assisted mom to latch baby in football position to left breast. Baby cuing to nurse, attempts to latch, but mom's nipples flat, large, and hard to compress. Fitted mom with a #20 NS, given a #24 as well with instructions. Mom able to hand express colostrum from both breast. Baby latches well, suckles rhythmically with a few swallows noted. Colostrum visible in the NS. Enc mom to offer lots of STS, nurse with cues, and allow baby to nurse until baby pulls off breast. Discussed with mom that NS is temporary and goal is to keep trying to latch directly to breast. Discussed need for additional stimulation with hand pump, 10 minutes on breast opposite from the one baby's nurses on. Also discussed need to carefully follow weight gain while on NS, and need to call for assistance with latching if baby not latching directly to breast after milk comes in. Mom given [facility] brochure, aware of OP/BFSG, community resources, and [facility] phone line assistance. Briefly discussed returning to work and nursing/pumping.  Maternal Data Has patient been taught Hand Expression?: Yes Does the patient have breastfeeding experience prior to this delivery?: No  Feeding Feeding Type: Breast Fed Length of feed: 0 min  LATCH Score/Interventions Latch: Grasps breast easily, tongue down, lips flanged, rhythmical sucking. Intervention(s): Teach feeding cues;Waking techniques;Skin to skin  Audible Swallowing: A few with stimulation  Type of Nipple: Flat Intervention(s): Hand pump;Shells  Comfort (Breast/Nipple): Soft / non-tender     Hold (Positioning): No assistance needed to correctly position infant at breast. Intervention(s):  Breastfeeding basics reviewed;Support Pillows  LATCH Score: 8  Lactation Tools Discussed/Used Tools: Nipple Shields Nipple shield size: 20   Consult Status Consult Status: Follow-up Date: 04/22/14 Follow-up type: In-patient    Geralynn OchsWILLIARD, Drew Herman 04/21/2014, 12:38 PM

## 2014-04-21 NOTE — Plan of Care (Signed)
Problem: Phase II Progression Outcomes Goal: Pain controlled on oral analgesia DR Ledora Bottcherocio Acosta notified that patient request percocet for cramping.

## 2014-04-21 NOTE — Progress Notes (Signed)
Post Partum Day 1 Subjective: Angela Lynch is a 23 y.o. G1P1001 3962w2d s/p SVD. No acute events overnight. Pt denies problems with ambulating, voiding or po intake. She denies nausea or vomiting. Pain is not well controlled and pt requesting Percocet. She has had flatus. She has not had bowel movement. Lochia Small. Plan for birth control is either the Mirena or OCP- patient is still deciding. Method of Feeding: breast. Plan for outpatient circumcision.    Objective: Blood pressure 97/45, pulse 101, temperature 98.2 F (36.8 C), temperature source Oral, resp. rate 18, height 5\' 3"  (1.6 m), weight 94.802 kg (209 lb), last menstrual period 06/12/2013, SpO2 99.00%.  Physical Exam:  General: alert and cooperative Lochia: appropriate Cardiovascular: RRR. No murmurs, rubs, or gallops.  Chest: Normal effort. Lungs CTA bilaterally.  Uterine Fundus: firm DVT Evaluation: No evidence of DVT seen on physical exam.   Recent Labs  04/19/14 0730 04/20/14 1156  HGB 11.0* 9.7*  HCT 32.8* 28.6*    Assessment/Plan: Plan for discharge tomorrow   LOS: 2 days   Angela Lynch, Tayla 04/21/2014, 9:10 AM   OB fellow attestation Post Partum Day 1 I have seen and examined this patient and agree with above documentation in the resident's note.   Angela Lynch is a 23 y.o. G1P0 s/p SVD.  Pt denies problems with ambulating, voiding or po intake. Pain is well controlled. Method of Feeding: breast  PE:  BP 97/45  Pulse 101  Temp(Src) 98.2 F (36.8 C) (Oral)  Resp 18  Ht 5\' 3"  (1.6 m)  Wt 209 lb (94.802 kg)  BMI 37.03 kg/m2  SpO2 99%  LMP 06/12/2013 Fundus firm  Plan for discharge: tomorrow, lactation consult 2/2 difficulty with breast feeding  Cami Delawder ROCIO, MD 11:59 AM

## 2014-04-22 ENCOUNTER — Encounter (HOSPITAL_COMMUNITY): Payer: Self-pay | Admitting: *Deleted

## 2014-04-22 MED ORDER — IBUPROFEN 600 MG PO TABS: 600.0000 mg | ORAL_TABLET | Freq: Four times a day (QID) | ORAL | Status: DC | PRN

## 2014-04-22 NOTE — Discharge Instructions (Signed)
Vaginal Delivery, Care After °Refer to this sheet in the next few weeks. These discharge instructions provide you with information on caring for yourself after delivery. Your caregiver may also give you specific instructions. Your treatment has been planned according to the most current medical practices available, but problems sometimes occur. Call your caregiver if you have any problems or questions after you go home. °HOME CARE INSTRUCTIONS °· Take over-the-counter or prescription medicines only as directed by your caregiver or pharmacist. °· Do not drink alcohol, especially if you are breastfeeding or taking medicine to relieve pain. °· Do not chew or smoke tobacco. °· Do not use illegal drugs. °· Continue to use good perineal care. Good perineal care includes: °¨ Wiping your perineum from front to back. °¨ Keeping your perineum clean. °· Do not use tampons or douche until your caregiver says it is okay. °· Shower, wash your hair, and take tub baths as directed by your caregiver. °· Wear a well-fitting bra that provides breast support. °· Eat healthy foods. °· Drink enough fluids to keep your urine clear or pale yellow. °· Eat high-fiber foods such as whole grain cereals and breads, brown rice, beans, and fresh fruits and vegetables every day. These foods may help prevent or relieve constipation. °· Follow your caregiver's recommendations regarding resumption of activities such as climbing stairs, driving, lifting, exercising, or traveling. °· Talk to your caregiver about resuming sexual activities. Resumption of sexual activities is dependent upon your risk of infection, your rate of healing, and your comfort and desire to resume sexual activity. °· Try to have someone help you with your household activities and your newborn for at least a few days after you leave the hospital. °· Rest as much as possible. Try to rest or take a nap when your newborn is sleeping. °· Increase your activities gradually. °· Keep  all of your scheduled postpartum appointments. It is very important to keep your scheduled follow-up appointments. At these appointments, your caregiver will be checking to make sure that you are healing physically and emotionally. °SEEK MEDICAL CARE IF:  °· You are passing large clots from your vagina. Save any clots to show your caregiver. °· You have a foul smelling discharge from your vagina. °· You have trouble urinating. °· You are urinating frequently. °· You have pain when you urinate. °· You have a change in your bowel movements. °· You have increasing redness, pain, or swelling near your vaginal incision (episiotomy) or vaginal tear. °· You have pus draining from your episiotomy or vaginal tear. °· Your episiotomy or vaginal tear is separating. °· You have painful, hard, or reddened breasts. °· You have a severe headache. °· You have blurred vision or see spots. °· You feel sad or depressed. °· You have thoughts of hurting yourself or your newborn. °· You have questions about your care, the care of your newborn, or medicines. °· You are dizzy or light-headed. °· You have a rash. °· You have nausea or vomiting. °· You were breastfeeding and have not had a menstrual period within 12 weeks after you stopped breastfeeding. °· You are not breastfeeding and have not had a menstrual period by the 12th week after delivery. °· You have a fever. °SEEK IMMEDIATE MEDICAL CARE IF:  °· You have persistent pain. °· You have chest pain. °· You have shortness of breath. °· You faint. °· You have leg pain. °· You have stomach pain. °· Your vaginal bleeding saturates two or more sanitary pads   in 1 hour. MAKE SURE YOU:   Understand these instructions.  Will watch your condition.  Will get help right away if you are not doing well or get worse. Document Released: 06/08/2000 Document Revised: 10/26/2013 Document Reviewed: 02/06/2012 [facility] Patient Information 2015 [employer] [place]. This information is not intended to  replace advice given to you by your health care provider. Make sure you discuss any questions you have with your health care provider.  Breast Pumping Tips If you are breastfeeding, there may be times when you cannot feed your baby directly. Returning to work or going on a trip are common examples. Pumping allows you to store breast milk and feed it to your baby later.  You may not get much milk when you first start to pump. Your breasts should start to make more after a few days. If you pump at the times you usually feed your baby, you may be able to keep making enough milk to feed your baby without also using formula. The more often you pump, the more milk you will produce. WHEN SHOULD I PUMP?   You can begin to pump soon after delivery. However, some experts recommend waiting about 4 weeks before giving your infant a bottle to make sure breastfeeding is going well.  If you plan to return to work, begin pumping a few weeks before. This will help you develop techniques that work best for you. It also lets you build up a supply of breast milk.   When you are with your infant, feed on demand and pump after each feeding.   When you are away from your infant for several hours, pump for about 15 minutes every 2-3 hours. Pump both breasts at the same time if you can.   If your infant has a formula feeding, make sure to pump around the same time.   If you drink any alcohol, wait 2 hours before pumping.  HOW DO I PREPARE TO PUMP? Your let-down reflexis the natural reaction to stimulation that makes your breast milk flow. It is easier to stimulate this reflex when you are relaxed. Find relaxation techniques that work for you. If you have difficulty with your let-down reflex, try these methods:   Smell one of your infant's blankets or an item of clothing.   Look at a picture or video of your infant.   Sit in a quiet, private space.   Massage the breast you plan to pump.   Place  soothing warmth on the breast.   Play relaxing music.  WHAT ARE SOME GENERAL BREAST PUMPING TIPS?  Wash your hands before you pump. You do not need to wash your nipples or breasts.  There are three ways to pump.  You can use your hand to massage and compress your breast.  You can use a handheld manual pump.  You can use an electric pump.   Make sure the suction cup (flange) on the breast pump is the right size. Place the flange directly over the nipple. If it is the wrong size or placed the wrong way, it may be painful and cause nipple damage.   If pumping is uncomfortable, apply a small amount of purified or modified lanolin to your nipple and areola.  If you are using an electric pump, adjust the speed and suction power to be more comfortable.  If pumping is painful or if you are not getting very much milk, you may need a different type of pump. A lactation consultant can help  you determine what type of pump to use.   Keep a full water bottle near you at all times. Drinking lots of fluid helps you make more milk.  You can store your milk to use later. Pumped breast milk can be stored in a sealable, sterile container or plastic bag. Label all stored breast milk with the date you pumped it.  Milk can stay out at room temperature for up to 8 hours.  You can store your milk in the refrigerator for up to 8 days.  You can store your milk in the freezer for 3 months. Thaw frozen milk using warm water. Do not put it in the microwave.  Do not smoke. Smoking can lower your milk supply and harm your infant. If you need help quitting, ask your health care provider to recommend a program.  WHEN SHOULD I CALL MY HEALTH CARE PROVIDER OR A LACTATION CONSULTANT?  You are having trouble pumping.  You are concerned that you are not making enough milk.  You have nipple pain, soreness, or redness.  You want to use birth control. Birth control pills may lower your milk supply. Talk to  your health care provider about your options. Document Released: 11/29/2009 Document Revised: 06/16/2013 Document Reviewed: 04/03/2013 [facility] Patient Information 2015 [place]. This information is not intended to replace advice given to you by your health care provider. Make sure you discuss any questions you have with your health care provider.

## 2014-04-22 NOTE — Discharge Summary (Signed)
Attestation of Attending Supervision of Advanced Practitioner (CNM/NP): Evaluation and management procedures were performed by the Advanced Practitioner under my supervision and collaboration.  I have reviewed the Advanced Practitioner's note and chart, and I agree with the management and plan.  HARRAWAY-SMITH, Caster Fayette 1:04 PM     

## 2014-04-22 NOTE — Lactation Note (Signed)
This note was copied from the chart of Angela Siri ColeKanika Llamas. Lactation Consultation Note  Patient Name: Angela Lynch WUJWJ'XToday's Date: 04/22/2014 Reason for consult: Follow-up assessment Baby 48 hours of life. Mom reports baby cluster-fed through the night. Mom states that she is seeing plenty of colostrum from left breast, but not sure about right breast. Mom states that her breasts are feeling fuller and heavier. Assisted mom to latch baby to right breast in football position. Mom states #20 NS feels uncomfortable. Re-fitted mom with #24 NS, mom reports increased comfort. Baby latches deeply, suckling rhythmically with intermittent swallows noted. Enc mom to keep baby's cheeks against breast and keep NS deeply and baby's mouth. Discussed continuing to try to latch baby directly to breast without the NS especially when milk comes in. Also discussed the need watch baby's weight carefully while using NS, and call for assistance with latching baby directly to breast. Mom states she has a follow-up appointment with baby's pediatrician in the a.m. While baby nursing, opposite breast begins dripping colostrum. Checked NS and there is colostrum pooled within the shield and evident in baby's mouth. Discussed ways to know baby getting enough while nursing. Referred mom to Baby and Me booklet for number of diapers to expect by day of life and EBM storage guidelines. Discussed engorgement prevention/treatment. Enc [facility] for [facility]. Discussed returning to work and BF/pumping. Mom aware of OP/BFSG and LC phone line services.   Maternal Data    Feeding Feeding Type: Breast Fed Length of feed:  (LC assess first 15 minutes of BF.)  LATCH Score/Interventions Latch: Grasps breast easily, tongue down, lips flanged, rhythmical sucking. Intervention(s): Assist with latch  Audible Swallowing: Spontaneous and intermittent  Type of Nipple: Flat Intervention(s): Hand pump  Comfort  (Breast/Nipple): Soft / non-tender     Hold (Positioning): No assistance needed to correctly position infant at breast. Intervention(s): Breastfeeding basics reviewed;Support Pillows  LATCH Score: 9  Lactation Tools Discussed/Used Tools: Nipple Shields Nipple shield size: 24   Consult Status Consult Status: Complete    Janei Scheff 04/22/2014, 11:20 AM

## 2014-04-22 NOTE — Discharge Summary (Signed)
Obstetric Discharge Summary Reason for Admission: rupture of membranes and onset of labor Prenatal Procedures: none Intrapartum Procedures: spontaneous vaginal delivery Postpartum Procedures: none Complications-Operative and Postpartum: none  23 y/o G1P0 at 1258w3d presented in labor went on to NSVD with epidural anesthesia. 3 vessel cord spontaneous placenta. EBL 400mL, No laceration. Apgar's 7 and 9. Mom is breast feeding. Using either the Mirena or POPs for birth control.   Hemoglobin  Date Value Ref Range Status  04/20/2014 9.7* 12.0 - 15.0 g/dL Final     HCT  Date Value Ref Range Status  04/20/2014 28.6* 36.0 - 46.0 % Final    Physical Exam:  General: alert and cooperative Lochia: appropriate Uterine Fundus: firm DVT Evaluation: No evidence of DVT seen on physical exam.  Discharge Diagnoses: Term Pregnancy-delivered  Discharge Information: Date: 04/22/2014 Activity: pelvic rest Diet: routine Medications: Ibuprofen Condition: stable Instructions: refer to practice specific booklet Discharge to: home   Newborn Data: Live born female  Birth Weight: 7 lb 13.2 oz (3550 g) APGAR: 7, 9  Home with mother.  [name], Tayla 04/22/2014, 7:38 AM  I was present for the exam and agree with above.   ReftonVirginia Lebron Nauert, CNM 04/22/2014 11:21 AM

## 2014-04-26 ENCOUNTER — Encounter (HOSPITAL_COMMUNITY): Payer: Self-pay | Admitting: *Deleted

## 2014-06-02 ENCOUNTER — Ambulatory Visit: Payer: Medicaid Other | Admitting: Obstetrics & Gynecology

## 2014-06-02 ENCOUNTER — Telehealth: Payer: Self-pay | Admitting: *Deleted

## 2014-06-02 ENCOUNTER — Encounter: Payer: Self-pay | Admitting: *Deleted

## 2014-06-02 NOTE — Telephone Encounter (Signed)
Pt no show for appointment today. Called patient no answer. Letter sent.

## 2014-06-30 ENCOUNTER — Ambulatory Visit: Payer: Medicaid Other | Admitting: Obstetrics & Gynecology

## 2014-08-16 ENCOUNTER — Ambulatory Visit: Admitting: Nurse Practitioner

## 2015-02-21 ENCOUNTER — Other Ambulatory Visit (HOSPITAL_COMMUNITY): Payer: Self-pay | Admitting: Urology

## 2015-02-21 DIAGNOSIS — Z3689 Encounter for other specified antenatal screening: Secondary | ICD-10-CM

## 2015-02-21 DIAGNOSIS — Z3A18 18 weeks gestation of pregnancy: Secondary | ICD-10-CM

## 2015-03-07 ENCOUNTER — Ambulatory Visit (HOSPITAL_COMMUNITY)
Admission: RE | Admit: 2015-03-07 | Discharge: 2015-03-07 | Disposition: A | Discharge status: Home / Self Care | Payer: Medicaid Other | Source: Ambulatory Visit | Attending: Physician Assistant | Admitting: Physician Assistant

## 2015-03-07 DIAGNOSIS — Z3A18 18 weeks gestation of pregnancy: Secondary | ICD-10-CM | POA: Insufficient documentation

## 2015-03-07 DIAGNOSIS — Z36 Encounter for antenatal screening of mother: Secondary | ICD-10-CM | POA: Insufficient documentation

## 2015-03-07 DIAGNOSIS — Z3689 Encounter for other specified antenatal screening: Secondary | ICD-10-CM

## 2015-03-15 ENCOUNTER — Other Ambulatory Visit (HOSPITAL_COMMUNITY): Payer: Self-pay | Admitting: Nurse Practitioner

## 2015-03-15 DIAGNOSIS — Z3A20 20 weeks gestation of pregnancy: Secondary | ICD-10-CM

## 2015-03-15 DIAGNOSIS — Z3689 Encounter for other specified antenatal screening: Secondary | ICD-10-CM

## 2015-04-05 ENCOUNTER — Ambulatory Visit (HOSPITAL_COMMUNITY)
Admission: RE | Admit: 2015-04-05 | Discharge: 2015-04-05 | Disposition: A | Discharge status: Home / Self Care | Payer: Medicaid Other | Source: Ambulatory Visit | Attending: Nurse Practitioner | Admitting: Nurse Practitioner

## 2015-04-05 ENCOUNTER — Other Ambulatory Visit (HOSPITAL_COMMUNITY): Payer: Self-pay | Admitting: Nurse Practitioner

## 2015-04-05 DIAGNOSIS — Z36 Encounter for antenatal screening of mother: Secondary | ICD-10-CM | POA: Insufficient documentation

## 2015-04-05 DIAGNOSIS — Z3A20 20 weeks gestation of pregnancy: Secondary | ICD-10-CM

## 2015-04-05 DIAGNOSIS — Z3689 Encounter for other specified antenatal screening: Secondary | ICD-10-CM

## 2015-04-05 DIAGNOSIS — Z0489 Encounter for examination and observation for other specified reasons: Secondary | ICD-10-CM

## 2015-04-05 DIAGNOSIS — IMO0002 Reserved for concepts with insufficient information to code with codable children: Secondary | ICD-10-CM

## 2015-04-05 DIAGNOSIS — O0932 Supervision of pregnancy with insufficient antenatal care, second trimester: Secondary | ICD-10-CM

## 2015-06-26 NOTE — L&D Delivery Note (Signed)
Delivery Note  Patient presented in latent labor. Patient progressed with pitocin augmentation.  At 11:53 PM a viable female was delivered via  (Presentation: OA  ).  APGAR: 7/9 ; weight 3487  g.   Placenta status: intact.  Cord: 3 vessel.  with the following complications: none  Cord pH: not obtained  Anesthesia: None  Episiotomy:  none Lacerations:  none Est. Blood Loss (mL):  100  Mom to postpartum.  Baby to Couplet care / Skin to Skin.  Cherrie Gauze Jackelyn Illingworth 08/16/2015, 12:05 AM

## 2015-08-15 ENCOUNTER — Encounter (HOSPITAL_COMMUNITY): Payer: Self-pay | Admitting: *Deleted

## 2015-08-15 ENCOUNTER — Inpatient Hospital Stay (HOSPITAL_COMMUNITY)
Admission: AD | Admit: 2015-08-15 | Discharge: 2015-08-17 | DRG: 775 | Disposition: A | Discharge status: Home / Self Care | Payer: Medicaid Other | Source: Ambulatory Visit | Attending: Family Medicine | Admitting: Family Medicine

## 2015-08-15 DIAGNOSIS — O4292 Full-term premature rupture of membranes, unspecified as to length of time between rupture and onset of labor: Principal | ICD-10-CM | POA: Diagnosis present

## 2015-08-15 DIAGNOSIS — O9962 Diseases of the digestive system complicating childbirth: Secondary | ICD-10-CM | POA: Diagnosis present

## 2015-08-15 DIAGNOSIS — Z3A39 39 weeks gestation of pregnancy: Secondary | ICD-10-CM

## 2015-08-15 DIAGNOSIS — Z87891 Personal history of nicotine dependence: Secondary | ICD-10-CM | POA: Diagnosis not present

## 2015-08-15 DIAGNOSIS — K219 Gastro-esophageal reflux disease without esophagitis: Secondary | ICD-10-CM | POA: Diagnosis present

## 2015-08-15 DIAGNOSIS — O42119 Preterm premature rupture of membranes, onset of labor more than 24 hours following rupture, unspecified trimester: Secondary | ICD-10-CM

## 2015-08-15 DIAGNOSIS — O0930 Supervision of pregnancy with insufficient antenatal care, unspecified trimester: Secondary | ICD-10-CM

## 2015-08-15 DIAGNOSIS — O4212 Full-term premature rupture of membranes, onset of labor more than 24 hours following rupture: Secondary | ICD-10-CM | POA: Diagnosis present

## 2015-08-15 DIAGNOSIS — Z820 Family history of epilepsy and other diseases of the nervous system: Secondary | ICD-10-CM

## 2015-08-15 DIAGNOSIS — Z3400 Encounter for supervision of normal first pregnancy, unspecified trimester: Secondary | ICD-10-CM

## 2015-08-15 DIAGNOSIS — E669 Obesity, unspecified: Secondary | ICD-10-CM | POA: Diagnosis present

## 2015-08-15 LAB — OB RESULTS CONSOLE GBS: GBS: NEGATIVE

## 2015-08-15 LAB — CBC
HEMATOCRIT: 30.8 % — AB (ref 36.0–46.0)
Hemoglobin: 10 g/dL — ABNORMAL LOW (ref 12.0–15.0)
MCH: 26.8 pg (ref 26.0–34.0)
MCHC: 32.5 g/dL (ref 30.0–36.0)
MCV: 82.6 fL (ref 78.0–100.0)
PLATELETS: 139 10*3/uL — AB (ref 150–400)
RBC: 3.73 MIL/uL — ABNORMAL LOW (ref 3.87–5.11)
RDW: 14.9 % (ref 11.5–15.5)
WBC: 13.8 10*3/uL — ABNORMAL HIGH (ref 4.0–10.5)

## 2015-08-15 LAB — OB RESULTS CONSOLE RPR: RPR: NONREACTIVE

## 2015-08-15 LAB — OB RESULTS CONSOLE HIV ANTIBODY (ROUTINE TESTING): HIV: NONREACTIVE

## 2015-08-15 LAB — TYPE AND SCREEN
ABO/RH(D): A POS
Antibody Screen: NEGATIVE

## 2015-08-15 LAB — OB RESULTS CONSOLE GC/CHLAMYDIA
CHLAMYDIA, DNA PROBE: NEGATIVE
Gonorrhea: NEGATIVE

## 2015-08-15 MED ORDER — FENTANYL CITRATE (PF) 100 MCG/2ML IJ SOLN
100.0000 ug | INTRAMUSCULAR | Status: DC | PRN
  Administered 2015-08-15: 100 ug via INTRAVENOUS
  Filled 2015-08-15: qty 2

## 2015-08-15 MED ORDER — LACTATED RINGERS IV SOLN: 500.0000 mL | INTRAVENOUS | Status: DC | PRN

## 2015-08-15 MED ORDER — OXYTOCIN 10 UNIT/ML IJ SOLN: 2.5000 [IU]/h | INTRAMUSCULAR | Status: DC

## 2015-08-15 MED ORDER — ONDANSETRON HCL 4 MG/2ML IJ SOLN: 4.0000 mg | Freq: Four times a day (QID) | INTRAMUSCULAR | Status: DC | PRN

## 2015-08-15 MED ORDER — EPHEDRINE 5 MG/ML INJ
10.0000 mg | INTRAVENOUS | Status: DC | PRN
  Filled 2015-08-15: qty 2

## 2015-08-15 MED ORDER — DIPHENHYDRAMINE HCL 50 MG/ML IJ SOLN
12.5000 mg | INTRAMUSCULAR | Status: DC | PRN
Start: 2015-08-15 — End: 2015-08-16

## 2015-08-15 MED ORDER — LACTATED RINGERS IV SOLN: 500.0000 mL | Freq: Once | INTRAVENOUS | Status: DC

## 2015-08-15 MED ORDER — OXYTOCIN BOLUS FROM INFUSION: 500.0000 mL | INTRAVENOUS | Status: DC

## 2015-08-15 MED ORDER — PHENYLEPHRINE 40 MCG/ML (10ML) SYRINGE FOR IV PUSH (FOR BLOOD PRESSURE SUPPORT)
80.0000 ug | PREFILLED_SYRINGE | INTRAVENOUS | Status: DC | PRN
  Filled 2015-08-15: qty 2

## 2015-08-15 MED ORDER — TERBUTALINE SULFATE 1 MG/ML IJ SOLN
0.2500 mg | Freq: Once | INTRAMUSCULAR | Status: DC | PRN
  Filled 2015-08-15: qty 1

## 2015-08-15 MED ORDER — OXYCODONE-ACETAMINOPHEN 5-325 MG PO TABS
2.0000 | ORAL_TABLET | ORAL | Status: DC | PRN
  Administered 2015-08-16: 2 via ORAL
  Filled 2015-08-15: qty 2

## 2015-08-15 MED ORDER — OXYCODONE-ACETAMINOPHEN 5-325 MG PO TABS: 1.0000 | ORAL_TABLET | ORAL | Status: DC | PRN

## 2015-08-15 MED ORDER — LIDOCAINE HCL (PF) 1 % IJ SOLN
30.0000 mL | INTRAMUSCULAR | Status: DC | PRN
  Filled 2015-08-15: qty 30

## 2015-08-15 MED ORDER — ACETAMINOPHEN 325 MG PO TABS: 650.0000 mg | ORAL_TABLET | ORAL | Status: DC | PRN

## 2015-08-15 MED ORDER — FLEET ENEMA 7-19 GM/118ML RE ENEM: 1.0000 | ENEMA | RECTAL | Status: DC | PRN

## 2015-08-15 MED ORDER — OXYTOCIN 10 UNIT/ML IJ SOLN
1.0000 m[IU]/min | INTRAMUSCULAR | Status: DC
  Administered 2015-08-15: 2 m[IU]/min via INTRAVENOUS
  Filled 2015-08-15: qty 4

## 2015-08-15 MED ORDER — CITRIC ACID-SODIUM CITRATE 334-500 MG/5ML PO SOLN: 30.0000 mL | ORAL | Status: DC | PRN

## 2015-08-15 MED ORDER — FENTANYL 2.5 MCG/ML BUPIVACAINE 1/10 % EPIDURAL INFUSION (WH - ANES): 14.0000 mL/h | INTRAMUSCULAR | Status: DC | PRN

## 2015-08-15 MED ORDER — LACTATED RINGERS IV SOLN
INTRAVENOUS | Status: DC
  Administered 2015-08-15: 16:00:00 via INTRAVENOUS

## 2015-08-15 NOTE — H&P (Signed)
Angela Lynch is a 25 y.o. female presenting for contractions. Patient states that her contractions began last night around 11:30PM. At that time, the contractions were about 15-30 minutes apart. This morning around 6 AM the contractions became more frequent and painful; about every 5-10 minutes. Denies any vaginal discharge or gushes of fluid. Denies any complications with her current pregnancy or her last. She is pregnant with a baby girl and plans the breastfeed her. After birth, she would like to use OCPs.   Maternal Medical History:  Reason for admission: Nausea.    OB History    Gravida Para Term Preterm AB TAB SAB Ectopic Multiple Living   0 0 0 0 0 0 1     Past Medical History  Diagnosis Date  . Medical history non-contributory   . Vaginal Pap smear, abnormal   . GERD (gastroesophageal reflux disease)    Past Surgical History  Procedure Laterality Date  . No past surgeries     Family History: family history includes Dementia in her maternal grandmother; Parkinson's disease in her maternal grandmother. Social History:  reports that she has quit smoking. She has never used smokeless tobacco. She reports that she does not drink alcohol or use illicit drugs.   Prenatal Transfer Tool  Maternal Diabetes: No Genetic Screening: Normal Maternal Ultrasounds/Referrals: Normal Fetal Ultrasounds or other Referrals:  None Maternal Substance Abuse:  No Significant Maternal Medications:  None Significant Maternal Lab Results:  None Other Comments:  None  Review of Systems  Constitutional: Negative for fever and chills.  HENT: Negative for hearing loss.   Eyes: Negative for blurred vision.  Respiratory: Negative for cough and wheezing.   Cardiovascular: Negative for chest pain, palpitations and leg swelling.  Gastrointestinal: Negative for heartburn, nausea, vomiting, abdominal pain, diarrhea and constipation.  Genitourinary: Negative for dysuria.  Musculoskeletal:  Negative for back pain.  Skin: Negative for rash.  Neurological: Negative for dizziness, tingling, tremors and headaches.  Endo/Heme/Allergies: Does not bruise/bleed easily.  Psychiatric/Behavioral: Negative for depression.    Dilation: 4 Effacement (%): 50 Station: -2 Exam by:: Dr. Ashok Pall Blood pressure 124/65, pulse 88, temperature 98.5 F (36.9 C), temperature source Oral, resp. rate 20, height  (1.676 m), weight 90.719 kg (200 lb), unknown if currently breastfeeding. Maternal Exam:  Abdomen: Patient reports no abdominal tenderness.   Physical Exam  Constitutional: She is oriented to person, place, and time. She appears well-developed and well-nourished. No distress.  HENT:  Head: Normocephalic and atraumatic.  Right Ear: External ear normal.  Mouth/Throat: Oropharynx is clear and moist.  Eyes: Pupils are equal, round, and reactive to light.  Neck: Neck supple.  Cardiovascular: Normal rate, regular rhythm and intact distal pulses.   Murmur (systolic) heard. Respiratory: Effort normal and breath sounds normal.  GI: Bowel sounds are normal. She exhibits distension (gravid abdomen).  Musculoskeletal: Normal range of motion. She exhibits no edema.  Neurological: She is alert and oriented to person, place, and time. She has normal reflexes.  Skin: Skin is warm and dry.  Psychiatric: She has a normal mood and affect. Her behavior is normal. Thought content normal.    Prenatal labs: ABO, Rh:  A Pos  Antibody:  neg Rubella:  Immune RPR: Nonreactive (02/20 1349)  HBsAg:   Neg HIV: Non-reactive (02/20 1349)  GBS: Negative (02/20 1349)   Assessment/Plan: Angela Lynch is a 25 y.o. G2P1001 at [redacted]w[redacted]d presenting with contractions and active labor #Labor: progressing appropriately, will continue to do q  4 cervical checks #Pain:No pain medication at this time #FWB:Category 1 #ID: GBS neg #MOF: Breast #MOC: POP #Circ: n/a  Beaulah Dinning 08/15/2015, 4:45 PM    OB FELLOW HISTORY AND PHYSICAL ATTESTATION  I have seen and examined this patient; I agree with above documentation in the resident's note.    Cherrie Gauze Manju Kulkarni 08/15/2015, 6:42 PM

## 2015-08-16 ENCOUNTER — Encounter (HOSPITAL_COMMUNITY): Payer: Self-pay | Admitting: *Deleted

## 2015-08-16 LAB — RPR: RPR: NONREACTIVE

## 2015-08-16 MED ORDER — WITCH HAZEL-GLYCERIN EX PADS: 1.0000 "application " | MEDICATED_PAD | CUTANEOUS | Status: DC | PRN

## 2015-08-16 MED ORDER — MEASLES, MUMPS & RUBELLA VAC ~~LOC~~ INJ
0.5000 mL | INJECTION | Freq: Once | SUBCUTANEOUS | Status: DC
  Filled 2015-08-16: qty 0.5

## 2015-08-16 MED ORDER — DIPHENHYDRAMINE HCL 25 MG PO CAPS: 25.0000 mg | ORAL_CAPSULE | Freq: Four times a day (QID) | ORAL | Status: DC | PRN

## 2015-08-16 MED ORDER — BENZOCAINE-MENTHOL 20-0.5 % EX AERO
1.0000 "application " | INHALATION_SPRAY | CUTANEOUS | Status: DC | PRN
  Administered 2015-08-16: 1 via TOPICAL
  Filled 2015-08-16: qty 56

## 2015-08-16 MED ORDER — ONDANSETRON HCL 4 MG PO TABS: 4.0000 mg | ORAL_TABLET | ORAL | Status: DC | PRN

## 2015-08-16 MED ORDER — IBUPROFEN 600 MG PO TABS
600.0000 mg | ORAL_TABLET | Freq: Four times a day (QID) | ORAL | Status: DC
  Administered 2015-08-16 – 2015-08-17 (×6): 600 mg via ORAL
  Filled 2015-08-16 (×6): qty 1

## 2015-08-16 MED ORDER — SENNOSIDES-DOCUSATE SODIUM 8.6-50 MG PO TABS
2.0000 | ORAL_TABLET | ORAL | Status: DC
  Administered 2015-08-17: 2 via ORAL
  Filled 2015-08-16: qty 2

## 2015-08-16 MED ORDER — ZOLPIDEM TARTRATE 5 MG PO TABS: 5.0000 mg | ORAL_TABLET | Freq: Every evening | ORAL | Status: DC | PRN

## 2015-08-16 MED ORDER — TETANUS-DIPHTH-ACELL PERTUSSIS 5-2.5-18.5 LF-MCG/0.5 IM SUSP: 0.5000 mL | Freq: Once | INTRAMUSCULAR | Status: DC

## 2015-08-16 MED ORDER — ACETAMINOPHEN 325 MG PO TABS: 650.0000 mg | ORAL_TABLET | ORAL | Status: DC | PRN

## 2015-08-16 MED ORDER — PRENATAL MULTIVITAMIN CH
1.0000 | ORAL_TABLET | Freq: Every day | ORAL | Status: DC
  Administered 2015-08-16: 1 via ORAL
  Filled 2015-08-16: qty 1

## 2015-08-16 MED ORDER — LANOLIN HYDROUS EX OINT: TOPICAL_OINTMENT | CUTANEOUS | Status: DC | PRN

## 2015-08-16 MED ORDER — DIBUCAINE 1 % RE OINT: 1.0000 "application " | TOPICAL_OINTMENT | RECTAL | Status: DC | PRN

## 2015-08-16 MED ORDER — ONDANSETRON HCL 4 MG/2ML IJ SOLN: 4.0000 mg | INTRAMUSCULAR | Status: DC | PRN

## 2015-08-16 MED ORDER — SIMETHICONE 80 MG PO CHEW: 80.0000 mg | CHEWABLE_TABLET | ORAL | Status: DC | PRN

## 2015-08-16 MED ORDER — OXYCODONE HCL 5 MG PO TABS
5.0000 mg | ORAL_TABLET | ORAL | Status: DC | PRN
Start: 2015-08-16 — End: 2015-08-17

## 2015-08-16 NOTE — Progress Notes (Signed)
UR chart review completed.  

## 2015-08-16 NOTE — Progress Notes (Signed)
Post Partum Day 1 Subjective: no complaints, up ad lib, voiding, tolerating PO and + flatus. Has some mild pelvic pain relieved with Motrin.   Objective: Blood pressure 127/45, pulse 72, temperature 98.7 F (37.1 C), temperature source Oral, resp. rate 18, height  (1.676 m), weight 90.719 kg (200 lb), unknown if currently breastfeeding.  Physical Exam:  General: alert, cooperative and no distress Lochia: appropriate Uterine Fundus: firm DVT Evaluation: No evidence of DVT seen on physical exam. Negative Homan's sign.   Recent Labs  08/15/15 1652  HGB 10.0*  HCT 30.8*    Assessment/Plan: Plan for discharge tomorrow   LOS: 1 day   Beaulah Dinning 08/16/2015, 7:43 AM

## 2015-08-16 NOTE — Lactation Note (Addendum)
This note was copied from a baby's chart. Lactation Consultation Note  P2, BF first baby for 3 weeks and stopped due to exhaustion. Baby sleeping STS on mother's chest. Encouraged her to hand express before latching and call for help if needed. States this baby is breastfeeding well.  She is using hand pump to help evert nipples. Discussed cluster feedings and basics. Mom encouraged to feed baby 8-12 times/24 hours and with feeding cues.  Mom made aware of O/P services, breastfeeding support groups, community resources, and our phone # for post-discharge questions.    Patient Name: Angela Lynch [id number] Date: 08/16/2015 Reason for consult: Initial assessment   Maternal Data Has patient been taught Hand Expression?: Yes Does the patient have breastfeeding experience prior to this delivery?: Yes  Feeding    LATCH Score/Interventions                      Lactation Tools Discussed/Used     Consult Status Consult Status: Follow-up Date: 08/17/15 Follow-up type: In-patient    [name] [facility] 08/16/2015, 12:02 PM

## 2015-08-17 MED ORDER — NORETHINDRONE 0.35 MG PO TABS: 1.0000 | ORAL_TABLET | Freq: Every day | ORAL | Status: DC

## 2015-08-17 MED ORDER — IBUPROFEN 600 MG PO TABS: 600.0000 mg | ORAL_TABLET | Freq: Four times a day (QID) | ORAL | Status: DC | PRN

## 2015-08-17 NOTE — Progress Notes (Signed)
Post Partum Day 2 Subjective: no complaints, up ad lib, voiding, tolerating PO, + flatus and pain is minimal and controlled with Motrin  Objective: Blood pressure 134/51, pulse 63, temperature 98 F (36.7 C), temperature source Other (Comment), resp. rate 16, height  (1.676 m), weight 90.719 kg (200 lb), SpO2 100 %, unknown if currently breastfeeding.  Physical Exam:  General: alert, cooperative and no distress Lochia: appropriate Uterine Fundus: firm DVT Evaluation: No evidence of DVT seen on physical exam. Negative Homan's sign. No significant calf/ankle edema.   Recent Labs  08/15/15 1652  HGB 10.0*  HCT 30.8*    Assessment/Plan: Discharge home   LOS: 2 days   Beaulah Dinning 08/17/2015, 7:27 AM   OB fellow attestation:  I have seen and examined this patient; I agree with above documentation in the resident's note.   Federico Flake, MD 9:06 AM

## 2015-08-17 NOTE — Discharge Summary (Signed)
Opened in error, see discharge summary on 2/22 signed by Dr. Jolayne Panther for official documentation

## 2015-08-17 NOTE — Discharge Summary (Signed)
OB Discharge Summary     Patient Name: Angela Lynch DOB: [birth year] MRN: [id number]  Date of admission: 08/15/2015 Delivering MD: Shonna Chock [place]   Date of discharge: 08/17/2015  Admitting diagnosis: 40WKS,CTX Intrauterine pregnancy: [redacted]w[redacted]d     Secondary diagnosis:  Principal Problem:   NSVD (normal spontaneous vaginal delivery) Active Problems:   Obesity   Supervision of normal first pregnancy   Insufficient prenatal care   Premature rupture of membranes (PROM) at term with onset of labor after 24 hours, antepartum   Labor and delivery indication for care or intervention  Additional problems: none     Discharge diagnosis: Term Pregnancy Delivered                                                                                                Post partum procedures:none  Augmentation: Pitocin  Complications: None  Hospital course:  Onset of Labor With Vaginal Delivery     25 y.o. yo [id number] at [redacted]w[redacted]d was admitted in Latent Labor on 08/15/2015. Patient had an uncomplicated labor course as follows:  Membrane Rupture Time/Date: 10:58 PM ,08/15/2015   Intrapartum Procedures: Episiotomy: None [1]                                         Lacerations:  None [1]  Patient had a delivery of a Viable infant. 08/15/2015  Information for the patient's newborn:  Laiklyn, [place] [[id number]]  Delivery Method: Vaginal, Spontaneous Delivery (Filed from Delivery Summary)    Pateint had an uncomplicated postpartum course.  She is ambulating, tolerating a regular diet, passing flatus, and urinating well. Patient is discharged home in stable condition on 08/17/2015.    Physical exam  Filed Vitals:   08/16/15 0637 08/16/15 1309 08/16/15 1800 08/17/15 0559  BP: 127/45 127/53 128/52 134/51  Pulse: 72 69 82 63  Temp: 98.7 F (37.1 C) 98.4 F (36.9 C) 98.2 F (36.8 C) 98 F (36.7 C)  TempSrc: Oral Oral Oral Other (Comment)  Resp: Height:      Weight:      SpO2:   100%     General: alert, cooperative and no distress Lochia: appropriate Uterine Fundus: firm Incision: N/A DVT Evaluation: No evidence of DVT seen on physical exam. Labs: Lab Results  Component Value Date   WBC 13.8* 08/15/2015   HGB 10.0* 08/15/2015   HCT 30.8* 08/15/2015   MCV 82.6 08/15/2015   PLT 139* 08/15/2015   CMP Latest Ref Rng 11/01/2011  Glucose 70 - 99 mg/dL 92  BUN 6 - 23 mg/dL 12  Creatinine 1.30 - 8.65 mg/dL 7.84  Sodium 696 - 295 mEq/L 140  Potassium 3.5 - 5.1 mEq/L 3.8  Chloride 96 - 112 mEq/L 105    Discharge instruction: per After Visit Summary and "Baby and Me Booklet".  After visit meds:    Medication List    TAKE these medications        ibuprofen 600 MG tablet  Commonly known  as:  ADVIL,MOTRIN  Take 1 tablet (600 mg total) by mouth every 6 (six) hours as needed for cramping.     norethindrone 0.35 MG tablet  Commonly known as:  MICRONOR,CAMILA,ERRIN  Take 1 tablet (0.35 mg total) by mouth daily.  Start taking on:  09/14/2015      ASK your doctor about these medications        prenatal multivitamin Tabs tablet  Take 1 tablet by mouth daily at 12 noon.        Diet: routine diet  Activity: Advance as tolerated. Pelvic rest for 6 weeks.   Outpatient follow up:6 weeks  Postpartum contraception: Progesterone only pills- start in 4 weeks  Newborn Data: Live born female  Birth Weight: 7 lb 11 oz (3487 g) APGAR: 7, 9  Baby Feeding: Bottle Disposition:home with mother   08/17/2015 Federico Flake, MD

## 2016-10-15 ENCOUNTER — Encounter: Payer: Self-pay | Admitting: *Deleted

## 2016-10-15 ENCOUNTER — Ambulatory Visit: Payer: Medicaid Other | Admitting: *Deleted

## 2016-10-15 DIAGNOSIS — Z3201 Encounter for pregnancy test, result positive: Secondary | ICD-10-CM

## 2016-10-15 NOTE — Progress Notes (Signed)
Pt in for pregnancy test. Pregnancy confirmed with positive test. Pt thinks lmp was around beginning of January. She does not notice any fetal movement. Pt denies any current meds or allergies. She is undecided if she will continue the pregnancy. Support given. Also gave verification letter if she should decide to get prenatal care.

## 2016-10-17 LAB — POCT PREGNANCY, URINE: Preg Test, Ur: POSITIVE — AB

## 2016-11-23 ENCOUNTER — Other Ambulatory Visit (HOSPITAL_COMMUNITY): Payer: Self-pay | Admitting: Nurse Practitioner

## 2016-11-23 DIAGNOSIS — Z3682 Encounter for antenatal screening for nuchal translucency: Secondary | ICD-10-CM

## 2016-11-23 DIAGNOSIS — Z3A13 13 weeks gestation of pregnancy: Secondary | ICD-10-CM

## 2016-11-26 ENCOUNTER — Encounter (HOSPITAL_COMMUNITY): Payer: Self-pay | Admitting: *Deleted

## 2016-11-27 ENCOUNTER — Ambulatory Visit (HOSPITAL_COMMUNITY)
Admission: RE | Admit: 2016-11-27 | Discharge: 2016-11-27 | Disposition: A | Discharge status: Home / Self Care | Payer: Medicaid Other | Source: Ambulatory Visit | Attending: Nurse Practitioner | Admitting: Nurse Practitioner

## 2016-11-27 ENCOUNTER — Ambulatory Visit (HOSPITAL_COMMUNITY): Payer: Medicaid Other

## 2016-11-27 ENCOUNTER — Encounter (HOSPITAL_COMMUNITY): Payer: Self-pay

## 2017-04-06 ENCOUNTER — Encounter (HOSPITAL_BASED_OUTPATIENT_CLINIC_OR_DEPARTMENT_OTHER): Payer: Self-pay | Admitting: Emergency Medicine

## 2017-04-06 ENCOUNTER — Emergency Department (HOSPITAL_BASED_OUTPATIENT_CLINIC_OR_DEPARTMENT_OTHER)
Admission: EM | Admit: 2017-04-06 | Discharge: 2017-04-07 | Disposition: A | Discharge status: Home / Self Care | Payer: Medicaid Other | Attending: Emergency Medicine | Admitting: Emergency Medicine

## 2017-04-06 DIAGNOSIS — E860 Dehydration: Secondary | ICD-10-CM | POA: Diagnosis not present

## 2017-04-06 DIAGNOSIS — Z87891 Personal history of nicotine dependence: Secondary | ICD-10-CM | POA: Diagnosis not present

## 2017-04-06 DIAGNOSIS — R197 Diarrhea, unspecified: Secondary | ICD-10-CM | POA: Insufficient documentation

## 2017-04-06 LAB — URINALYSIS, ROUTINE W REFLEX MICROSCOPIC
Bilirubin Urine: NEGATIVE
Glucose, UA: NEGATIVE mg/dL
HGB URINE DIPSTICK: NEGATIVE
Ketones, ur: 15 mg/dL — AB
LEUKOCYTES UA: NEGATIVE
NITRITE: NEGATIVE
PROTEIN: 30 mg/dL — AB
Specific Gravity, Urine: 1.025 (ref 1.005–1.030)
pH: 6.5 (ref 5.0–8.0)

## 2017-04-06 LAB — URINALYSIS, MICROSCOPIC (REFLEX)

## 2017-04-06 MED ORDER — SODIUM CHLORIDE 0.9 % IV BOLUS (SEPSIS)
1000.0000 mL | Freq: Once | INTRAVENOUS | Status: AC
  Administered 2017-04-06: 1000 mL via INTRAVENOUS

## 2017-04-06 NOTE — ED Notes (Signed)
ED Provider at bedside. 

## 2017-04-06 NOTE — ED Triage Notes (Signed)
Patient states that she has had diarrhea for the last week. The patient reports that today she has had more than 8 episodes of Diarrhea today - patient also reports that she has not had an appetite recently

## 2017-04-06 NOTE — ED Triage Notes (Signed)
Called OB rapid response and spoke with [place]  - she recommended to check FHT's as long as the patient has no pregnancy related complaints or abdominal pain. - ptient denies either of these  - updated that patient of the plan and she states understanding

## 2017-04-06 NOTE — ED Notes (Signed)
Strong fetal heart rate and fetal movementt felt during fetal heart tone assessment.

## 2017-04-06 NOTE — ED Notes (Signed)
Pt given cup to attempt urine sample. 

## 2017-04-07 NOTE — ED Provider Notes (Signed)
[facility]   CSN: [id number] Arrival date & time: 04/06/17  1948     History   Chief Complaint Chief Complaint  Patient presents with  . Diarrhea    [redacted] weeks pregnant    HPI Angela Lynch is a 27 y.o. female.  HPI  This is a 26 year old female currently [redacted] weeks pregnant who presents with diarrhea. Patient reports one-week history of loose stools. However, over the last 24 hours she reports more than 8 episodes of loose watery stools. No abdominal pain, nausea, vomiting. She has not noted any blood in her stools. She does report decreased appetite. No vaginal bleeding.  She denies any contractions but states several days ago she had some abdominal cramping. No recent travel, antibiotic use. No known sick contacts. No fevers or infectious symptoms.  Past Medical History:  Diagnosis Date  . GERD (gastroesophageal reflux disease)   . Medical history non-contributory   . Vaginal Pap smear, abnormal     Patient Active Problem List   Diagnosis Date Noted  . NSVD (normal spontaneous vaginal delivery) 08/17/2015  . Labor and delivery indication for care or intervention 08/15/2015  . Vaginal delivery 04/22/2014  . Premature rupture of membranes (PROM) at term with onset of labor after 24 hours, antepartum 04/19/2014  . Supervision of normal first pregnancy 12/21/2013  . Insufficient prenatal care 12/21/2013  . Hyperlipidemia 12/10/2013  . GERD (gastroesophageal reflux disease) 12/10/2013  . Obesity 12/10/2013    Past Surgical History:  Procedure Laterality Date  . NO PAST SURGERIES      OB History    Gravida Para Term Preterm AB Living   0 0 2   SAB TAB Ectopic Multiple Live Births   0 0 0 0 2       Home Medications    Prior to Admission medications   Medication Sig Start Date End Date Taking? Authorizing Provider  ibuprofen (ADVIL,MOTRIN) 600 MG tablet Take 1 tablet (600 mg total) by mouth every 6 (six) hours as needed for  cramping. Patient not taking: Reported on 10/15/2016 08/17/15   Federico Flake, MD  norethindrone (MICRONOR,CAMILA,ERRIN) 0.35 MG tablet Take 1 tablet (0.35 mg total) by mouth daily. Patient not taking: Reported on 10/15/2016 09/14/15   Federico Flake, MD    Family History Family History  Problem Relation Age of Onset  . Parkinson's disease Maternal Grandmother   . Dementia Maternal Grandmother     Social History Social History  Substance Use Topics  . Smoking status: Former Games developer  . Smokeless tobacco: Never Used  . Alcohol use No     Comment: when not pregnant     Allergies   Patient has no known allergies.   Review of Systems Review of Systems  Constitutional: Negative for fever.  Respiratory: Negative for shortness of breath.   Cardiovascular: Negative for chest pain.  Gastrointestinal: Positive for diarrhea. Negative for abdominal pain, blood in stool, nausea and vomiting.  Genitourinary: Negative for dysuria.  All other systems reviewed and are negative.    Physical Exam Updated Vital Signs BP 123/69 (BP Location: Right Arm)   Pulse 79   Temp 98.4 F (36.9 C) (Oral)   Resp 18   Ht  (1.6 m)   Wt 86.6 kg (191 lb)   LMP 06/30/2016 (LMP Unknown)   SpO2 100%   BMI 33.83 kg/m   Physical Exam  Constitutional: She is oriented to person, place, and time. She appears  well-developed and well-nourished. No distress.  HENT:  Head: Normocephalic and atraumatic.  Mucous membranes moist  Cardiovascular: Normal rate, regular rhythm and normal heart sounds.   Pulmonary/Chest: Effort normal and breath sounds normal. No respiratory distress. She has no wheezes.  Abdominal: Soft. Bowel sounds are normal. There is no tenderness.  Gravid abdomen above the umbilicus  Neurological: She is alert and oriented to person, place, and time.  Skin: Skin is warm and dry.  Psychiatric: She has a normal mood and affect.  Nursing note and vitals reviewed.    ED  Treatments / Results  Labs (all labs ordered are listed, but only abnormal results are displayed) Labs Reviewed  URINALYSIS, ROUTINE W REFLEX MICROSCOPIC - Abnormal; Notable for the following:       Result Value   APPearance CLOUDY (*)    Ketones, ur 15 (*)    Protein, ur 30 (*)    All other components within normal limits  URINALYSIS, MICROSCOPIC (REFLEX) - Abnormal; Notable for the following:    Bacteria, UA MANY (*)    Squamous Epithelial / LPF 0-5 (*)    All other components within normal limits    EKG  EKG Interpretation None       Radiology No results found.  Procedures Procedures (including critical care time)  Medications Ordered in ED Medications  sodium chloride 0.9 % bolus 1,000 mL (1,000 mLs Intravenous New Bag/Given 04/06/17 2332)     Initial Impression / Assessment and Plan / ED Course  I have reviewed the triage vital signs and the nursing notes.  Pertinent labs & imaging results that were available during my care of the patient were reviewed by me and considered in my medical decision making (see chart for details).     Patient presents with diarrhea. This is isolated. No risk factors for infectious etiology or C. difficile. She is nontoxic-appearing. Urinalysis obtained and she has 15 ketones suggestive of mild dehydration. Patient was given a liter of fluids. Fetal heart tones reassuring with heart rate of 160.  Imodium is category C. Would avoid given no signs of significant dehydration at this time. Discussed with patient supportive measures and hydration precautions. At this time would not do any further workup given reassuring exam. Follow-up with OB if symptoms persist or worsen.  After history, exam, and medical workup I feel the patient has been appropriately medically screened and is safe for discharge home. Pertinent diagnoses were discussed with the patient. Patient was given return precautions.   Final Clinical Impressions(s) / ED Diagnoses     Final diagnoses:  Diarrhea, unspecified type  Dehydration    New Prescriptions New Prescriptions   No medications on file     Shon Baton, MD 04/07/17 [phone]

## 2017-04-07 NOTE — Discharge Instructions (Signed)
You were seen today for diarrhea. You appeared mildly dehydrated. It is very important that you stay hydrated given her pregnancy. If you develop bloody stools or worsening diarrhea, you may need to be reevaluated. Given your pregnancy, there are limited medications that can be given.

## 2017-08-23 ENCOUNTER — Encounter (HOSPITAL_COMMUNITY): Payer: Self-pay

## 2017-09-04 ENCOUNTER — Encounter (HOSPITAL_COMMUNITY): Payer: Self-pay | Admitting: Family Medicine

## 2017-09-04 ENCOUNTER — Ambulatory Visit (HOSPITAL_COMMUNITY)
Admission: EM | Admit: 2017-09-04 | Discharge: 2017-09-04 | Disposition: A | Discharge status: Home / Self Care | Payer: Medicaid Other | Attending: Family Medicine | Admitting: Family Medicine

## 2017-09-04 DIAGNOSIS — R6883 Chills (without fever): Secondary | ICD-10-CM | POA: Diagnosis not present

## 2017-09-04 DIAGNOSIS — J029 Acute pharyngitis, unspecified: Secondary | ICD-10-CM | POA: Diagnosis not present

## 2017-09-04 DIAGNOSIS — K219 Gastro-esophageal reflux disease without esophagitis: Secondary | ICD-10-CM | POA: Diagnosis not present

## 2017-09-04 DIAGNOSIS — J038 Acute tonsillitis due to other specified organisms: Secondary | ICD-10-CM | POA: Insufficient documentation

## 2017-09-04 DIAGNOSIS — Z87891 Personal history of nicotine dependence: Secondary | ICD-10-CM | POA: Insufficient documentation

## 2017-09-04 LAB — POCT RAPID STREP A: STREPTOCOCCUS, GROUP A SCREEN (DIRECT): NEGATIVE

## 2017-09-04 MED ORDER — CEFDINIR 300 MG PO CAPS: 600.0000 mg | ORAL_CAPSULE | Freq: Every day | ORAL | 0 refills | Status: DC

## 2017-09-04 NOTE — ED Provider Notes (Signed)
[facility]   [id number] 09/04/17 Arrival Time: 1808   SUBJECTIVE:  Angela MoleKanika Z Lynch is a 27 y.o. female who presents to the urgent care with complaint of flu-like symptoms.  She woke up with a sore throat and has had chills and sweats all day with worsening sore throat.   Past Medical History:  Diagnosis Date  . GERD (gastroesophageal reflux disease)   . Medical history non-contributory   . Vaginal Pap smear, abnormal    Family History  Problem Relation Age of Onset  . Parkinson's disease Maternal Grandmother   . Dementia Maternal Grandmother    Social History   Socioeconomic History  . Marital status: Single    Spouse name: Not on file  . Number of children: Not on file  . Years of education: Not on file  . Highest education level: Not on file  Social Needs  . Financial resource strain: Not on file  . Food insecurity - worry: Not on file  . Food insecurity - inability: Not on file  . Transportation needs - medical: Not on file  . Transportation needs - non-medical: Not on file  Occupational History  . Not on file  Tobacco Use  . Smoking status: Former Games developermoker  . Smokeless tobacco: Never Used  Substance and Sexual Activity  . Alcohol use: No    Comment: when not pregnant  . Drug use: No  . Sexual activity: Yes    Birth control/protection: None  Other Topics Concern  . Not on file  Social History Narrative   ** Merged History Encounter **       No outpatient medications have been marked as taking for the 09/04/17 encounter [facility](Hospital Encounter).   No Known Allergies    ROS: As per HPI, remainder of ROS negative.   OBJECTIVE:   Vitals:   09/04/17 1925  BP: 100/66  Pulse: 92  Temp: 99.3 F (37.4 C)  TempSrc: Oral  SpO2: 99%     General appearance: alert; no distress Eyes: PERRL; EOMI; conjunctiva normal HENT: normocephalic; atraumatic; swollen, coated tonsils bilaterally Neck: supple Back: no CVA tenderness Extremities: no  cyanosis or edema; symmetrical with no gross deformities Skin: warm and dry Neurologic: normal gait; grossly normal Psychological: alert and cooperative; normal mood and affect      Labs:  Results for orders placed or performed during the hospital encounter of 04/06/17  Urinalysis, Routine w reflex microscopic  Result Value Ref Range   Color, Urine YELLOW YELLOW   APPearance CLOUDY (A) CLEAR   Specific Gravity, Urine 1.025 1.005 - 1.030   pH 6.5 5.0 - 8.0   Glucose, UA NEGATIVE NEGATIVE mg/dL   Hgb urine dipstick NEGATIVE NEGATIVE   Bilirubin Urine NEGATIVE NEGATIVE   Ketones, ur 15 (A) NEGATIVE mg/dL   Protein, ur 30 (A) NEGATIVE mg/dL   Nitrite NEGATIVE NEGATIVE   Leukocytes, UA NEGATIVE NEGATIVE  Urinalysis, Microscopic (reflex)  Result Value Ref Range   RBC / HPF 0-5 0 - 5 RBC/hpf   WBC, UA 0-5 0 - 5 WBC/hpf   Bacteria, UA MANY (A) NONE SEEN   Squamous Epithelial / LPF 0-5 (A) NONE SEEN   Mucus PRESENT     Labs Reviewed  CULTURE, GROUP A STREP [facility])    No results found.     ASSESSMENT & PLAN:  1. Acute tonsillitis due to other specified organisms     Meds ordered this encounter  Medications  . cefdinir (OMNICEF) 300 MG capsule  Sig: Take 2 capsules (600 mg total) by mouth daily.    Dispense:  20 capsule    Refill:  0    Reviewed expectations re: course of current medical issues. Questions answered. Outlined signs and symptoms indicating need for more acute intervention. Patient verbalized understanding. After Visit Summary given.    Procedures:      Elvina Sidle, MD 09/04/17 1931

## 2017-09-04 NOTE — ED Triage Notes (Addendum)
Fatigue, chills, sore throat

## 2017-09-07 LAB — CULTURE, GROUP A STREP (THRC)

## 2017-09-09 ENCOUNTER — Telehealth (HOSPITAL_COMMUNITY): Payer: Self-pay | Admitting: *Deleted

## 2017-09-09 NOTE — Telephone Encounter (Signed)
Pt called regarding test results from recent visit, verbalized instructions to complete antibiotic.

## 2017-09-12 MED ORDER — CEFDINIR 300 MG PO CAPS: 600.0000 mg | ORAL_CAPSULE | Freq: Every day | ORAL | 0 refills | Status: DC

## 2018-03-26 ENCOUNTER — Emergency Department (HOSPITAL_BASED_OUTPATIENT_CLINIC_OR_DEPARTMENT_OTHER)
Admission: EM | Admit: 2018-03-26 | Discharge: 2018-03-26 | Disposition: A | Discharge status: Home / Self Care | Payer: Medicaid Other | Attending: Emergency Medicine | Admitting: Emergency Medicine

## 2018-03-26 ENCOUNTER — Encounter (HOSPITAL_BASED_OUTPATIENT_CLINIC_OR_DEPARTMENT_OTHER): Payer: Self-pay

## 2018-03-26 ENCOUNTER — Emergency Department (HOSPITAL_BASED_OUTPATIENT_CLINIC_OR_DEPARTMENT_OTHER): Payer: Medicaid Other

## 2018-03-26 ENCOUNTER — Other Ambulatory Visit: Payer: Self-pay

## 2018-03-26 DIAGNOSIS — J069 Acute upper respiratory infection, unspecified: Secondary | ICD-10-CM | POA: Diagnosis not present

## 2018-03-26 DIAGNOSIS — F17210 Nicotine dependence, cigarettes, uncomplicated: Secondary | ICD-10-CM | POA: Insufficient documentation

## 2018-03-26 DIAGNOSIS — R05 Cough: Secondary | ICD-10-CM | POA: Diagnosis present

## 2018-03-26 LAB — GROUP A STREP BY PCR: Group A Strep by PCR: NOT DETECTED

## 2018-03-26 MED ORDER — FLUTICASONE PROPIONATE 50 MCG/ACT NA SUSP: 1.0000 | Freq: Every day | NASAL | 2 refills | Status: DC

## 2018-03-26 MED ORDER — BENZONATATE 100 MG PO CAPS: 100.0000 mg | ORAL_CAPSULE | Freq: Three times a day (TID) | ORAL | 0 refills | Status: DC

## 2018-03-26 NOTE — ED Triage Notes (Signed)
C/o flu like sx day 2-NAD-steady gait 

## 2018-03-26 NOTE — Discharge Instructions (Signed)
You can take Tylenol or Ibuprofen as directed for pain. You can alternate Tylenol and Ibuprofen every 4 hours. If you take Tylenol at 1pm, then you can take Ibuprofen at 5pm. Then you can take Tylenol again at 9pm.   Findings as directed.  Use Tessalon Perles as directed.  Return to emergency department for any fever, difficulty breathing, vomiting, difficulty swallowing her saliva or any other worsening or concerning symptoms.

## 2018-03-26 NOTE — ED Provider Notes (Signed)
[facility] EMERGENCY DEPARTMENT Provider Note   CSN: [id number] Arrival date & time: 03/26/18  1740     History   Chief Complaint Chief Complaint  Patient presents with  . Cough    HPI Angela Lynch is a 27 y.o. female past medical and GERD who presents for evaluation of 2 days of nasal congestion, rhinorrhea, sore throat, cough that is been ongoing for last 2 days.  Patient reports that her husband and kids have been sick with similar symptoms.  She states that she has not coughed anything up.  She states that throat is hurting but she does not take any medication for it.  She is still able to tolerate her secretions and p.o. but does report worsening pain with swelling.  She has not noted any fever.  Patient states she has not any difficulty breathing.  She does report she smokes 3 black and milds a day.  No cigarettes or vaping.  Patient denies any chest pain, difficulty breathing, nausea/vomiting.  The history is provided by the patient.    Past Medical History:  Diagnosis Date  . GERD (gastroesophageal reflux disease)   . Medical history non-contributory   . Vaginal Pap smear, abnormal     Patient Active Problem List   Diagnosis Date Noted  . NSVD (normal spontaneous vaginal delivery) 08/17/2015  . Labor and delivery indication for care or intervention 08/15/2015  . Vaginal delivery 04/22/2014  . Premature rupture of membranes (PROM) at term with onset of labor after 24 hours, antepartum 04/19/2014  . Supervision of normal first pregnancy 12/21/2013  . Insufficient prenatal care 12/21/2013  . Hyperlipidemia 12/10/2013  . GERD (gastroesophageal reflux disease) 12/10/2013  . Obesity 12/10/2013    Past Surgical History:  Procedure Laterality Date  . NO PAST SURGERIES       OB History    Gravida  3   Para  2   Term  2   Preterm  0   AB  0   Living  2     SAB  0   TAB  0   Ectopic  0   Multiple  0   Live Births  2             Home Medications    Prior to Admission medications   Medication Sig Start Date End Date Taking? Authorizing Provider  benzonatate (TESSALON) 100 MG capsule Take 1 capsule (100 mg total) by mouth every 8 (eight) hours. 03/26/18   Maxwell Caul, PA-C  fluticasone (FLONASE) 50 MCG/ACT nasal spray Place 1 spray into both nostrils daily. 03/26/18   Maxwell Caul, PA-C    Family History Family History  Problem Relation Age of Onset  . Parkinson's disease Maternal Grandmother   . Dementia Maternal Grandmother     Social History Social History   Tobacco Use  . Smoking status: Current Every Day Smoker  . Smokeless tobacco: Never Used  Substance Use Topics  . Alcohol use: Yes    Comment: rare  . Drug use: No     Allergies   Patient has no known allergies.   Review of Systems Review of Systems  Constitutional: Negative for fever.  HENT: Positive for congestion, rhinorrhea and sore throat. Negative for drooling and trouble swallowing.   Respiratory: Positive for cough. Negative for shortness of breath.   Cardiovascular: Negative for chest pain.  Gastrointestinal: Negative for abdominal pain, nausea and vomiting.  All other systems reviewed and are negative.  Physical Exam Updated Vital Signs BP 128/69 (BP Location: Right Arm)   Pulse 67   Temp 99 F (37.2 C) (Oral)   Resp 18   Ht 5\' 3"  (1.6 m)   Wt 85.3 kg   SpO2 100%   BMI 33.30 kg/m   Physical Exam  Constitutional: She appears well-developed and well-nourished.  HENT:  Head: Normocephalic and atraumatic.  Nose: Mucosal edema present.  Mouth/Throat: Uvula is midline. No trismus in the jaw. Posterior oropharyngeal erythema present.  Edematous and erythematous bilateral turbinates.  Posterior oropharynx is slightly erythematous.  Uvula is midline.  No trismus.  Airways patent, phonation is intact.  Eyes: Conjunctivae and EOM are normal. Right eye exhibits no discharge. Left eye exhibits no discharge.  No scleral icterus.  Pulmonary/Chest: Effort normal.  Lungs clear to auscultation bilaterally.  Symmetric chest rise.  No wheezing, rales, rhonchi.  Neurological: She is alert.  Skin: Skin is warm and dry.  Psychiatric: She has a normal mood and affect. Her speech is normal and behavior is normal.  Nursing note and vitals reviewed.    ED Treatments / Results  Labs (all labs ordered are listed, but only abnormal results are displayed) Labs Reviewed  GROUP A STREP BY PCR    EKG None  Radiology Dg Chest 2 View  Result Date: 03/26/2018 CLINICAL DATA:  Cough, congestion x1 day EXAM: CHEST - 2 VIEW COMPARISON:  None. FINDINGS: Lungs are clear.  No pleural effusion or pneumothorax. The heart is normal in size. Visualized osseous structures are within normal limits. IMPRESSION: Normal chest radiographs. Electronically Signed   By: Charline Bills M.D.   On: 03/26/2018 19:22    Procedures Procedures (including critical care time)  Medications Ordered in ED Medications - No data to display   Initial Impression / Assessment and Plan / ED Course  I have reviewed the triage vital signs and the nursing notes.  Pertinent labs & imaging results that were available during my care of the patient were reviewed by me and considered in my medical decision making (see chart for details).     27 year old female who presents for evaluation of nasal congestion, rhinorrhea, sore throat, cough that is been ongoing for last 2 days.  Has been acute at home of been sick with similar symptoms.  No fever, vomiting, difficulty breathing. Patient is afebrile, non-toxic appearing, sitting comfortably on examination table. Vital signs reviewed and stable.  On exam posterior oropharynx is slightly erythematous.  Uvula is midline.  No trismus.  Consider pharyngitis versus postnasal drip.  History/physical exam is not concerning for peritonsillar abscess, Ludwig angina.  Lungs clear to auscultation patient does  report an cough and is concerned about infectious.  We will plan for chest x-ray for evaluation.  Strep ordered.  Strep reviewed.  Negative.  Chest x-ray reviewed negative for any acute infectious etiology.  Discussed results with patient.  Encouraged at home supportive care measures. Patient had ample opportunity for questions and discussion. All patient's questions were answered with full understanding. Strict return precautions discussed. Patient expresses understanding and agreement to plan.   Final Clinical Impressions(s) / ED Diagnoses   Final diagnoses:  Upper respiratory tract infection, unspecified type    ED Discharge Orders         Ordered    benzonatate (TESSALON) 100 MG capsule  Every 8 hours     03/26/18 1928    fluticasone (FLONASE) 50 MCG/ACT nasal spray  Daily,   Status:  Discontinued  03/26/18 1928    fluticasone (FLONASE) 50 MCG/ACT nasal spray  Daily     03/26/18 1930           Rosana Hoes 03/26/18 1941    Raeford Razor, MD 03/31/18 [phone]

## 2018-03-26 NOTE — ED Notes (Signed)
Given Sprite 

## 2018-04-21 ENCOUNTER — Encounter (HOSPITAL_BASED_OUTPATIENT_CLINIC_OR_DEPARTMENT_OTHER): Payer: Self-pay

## 2018-04-21 ENCOUNTER — Emergency Department (HOSPITAL_BASED_OUTPATIENT_CLINIC_OR_DEPARTMENT_OTHER)
Admission: EM | Admit: 2018-04-21 | Discharge: 2018-04-21 | Disposition: A | Discharge status: Home / Self Care | Payer: Medicaid Other | Attending: Emergency Medicine | Admitting: Emergency Medicine

## 2018-04-21 ENCOUNTER — Other Ambulatory Visit: Payer: Self-pay

## 2018-04-21 DIAGNOSIS — Y999 Unspecified external cause status: Secondary | ICD-10-CM | POA: Diagnosis not present

## 2018-04-21 DIAGNOSIS — Y929 Unspecified place or not applicable: Secondary | ICD-10-CM | POA: Diagnosis not present

## 2018-04-21 DIAGNOSIS — X500XXA Overexertion from strenuous movement or load, initial encounter: Secondary | ICD-10-CM | POA: Insufficient documentation

## 2018-04-21 DIAGNOSIS — Y939 Activity, unspecified: Secondary | ICD-10-CM | POA: Diagnosis not present

## 2018-04-21 DIAGNOSIS — F17210 Nicotine dependence, cigarettes, uncomplicated: Secondary | ICD-10-CM | POA: Diagnosis not present

## 2018-04-21 DIAGNOSIS — S3992XA Unspecified injury of lower back, initial encounter: Secondary | ICD-10-CM | POA: Diagnosis present

## 2018-04-21 DIAGNOSIS — S39012A Strain of muscle, fascia and tendon of lower back, initial encounter: Secondary | ICD-10-CM

## 2018-04-21 LAB — URINALYSIS, ROUTINE W REFLEX MICROSCOPIC
BILIRUBIN URINE: NEGATIVE
GLUCOSE, UA: NEGATIVE mg/dL
HGB URINE DIPSTICK: NEGATIVE
KETONES UR: NEGATIVE mg/dL
Leukocytes, UA: NEGATIVE
Nitrite: NEGATIVE
PROTEIN: NEGATIVE mg/dL
Specific Gravity, Urine: >1.03 — ABNORMAL HIGH (ref 1.005–1.030)
pH: 6 (ref 5.0–8.0)

## 2018-04-21 LAB — PREGNANCY, URINE: Preg Test, Ur: NEGATIVE

## 2018-04-21 MED ORDER — IBUPROFEN 600 MG PO TABS: 600.0000 mg | ORAL_TABLET | Freq: Four times a day (QID) | ORAL | 0 refills | Status: DC | PRN

## 2018-04-21 MED ORDER — KETOROLAC TROMETHAMINE 30 MG/ML IJ SOLN
30.0000 mg | Freq: Once | INTRAMUSCULAR | Status: AC
Start: 2018-04-21 — End: 2018-04-21
  Administered 2018-04-21: 30 mg via INTRAMUSCULAR
  Filled 2018-04-21: qty 1

## 2018-04-21 MED ORDER — CYCLOBENZAPRINE HCL 10 MG PO TABS: 10.0000 mg | ORAL_TABLET | Freq: Two times a day (BID) | ORAL | 0 refills | Status: DC | PRN

## 2018-04-21 NOTE — ED Triage Notes (Signed)
C/o lower back pain-started yesterday-denies injury-NAD-steady gait 

## 2018-04-21 NOTE — ED Provider Notes (Signed)
[facility] EMERGENCY DEPARTMENT Provider Note   CSN: [id number] Arrival date & time: 04/21/18  1930     History   Chief Complaint Chief Complaint  Patient presents with  . Back Pain    HPI Angela Lynch is a 27 y.o. female.  Pt presents to the ED today with left low back pain.  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  She has periodic trouble with her back.  It started hurting yesterday.  Pain stays in her back and does not radiate.  She denies urinary sx.       Past Medical History:  Diagnosis Date  . GERD (gastroesophageal reflux disease)   . Medical history non-contributory   . Vaginal Pap smear, abnormal     Patient Active Problem List   Diagnosis Date Noted  . NSVD (normal spontaneous vaginal delivery) 08/17/2015  . Labor and delivery indication for care or intervention 08/15/2015  . Vaginal delivery 04/22/2014  . Premature rupture of membranes (PROM) at term with onset of labor after 24 hours, antepartum 04/19/2014  . Supervision of normal first pregnancy 12/21/2013  . Insufficient prenatal care 12/21/2013  . Hyperlipidemia 12/10/2013  . GERD (gastroesophageal reflux disease) 12/10/2013  . Obesity 12/10/2013    Past Surgical History:  Procedure Laterality Date  . NO PAST SURGERIES       OB History    Gravida  3   Para  2   Term  2   Preterm  0   AB  0   Living  2     SAB  0   TAB  0   Ectopic  0   Multiple  0   Live Births  2            Home Medications    Prior to Admission medications   Medication Sig Start Date End Date Taking? Authorizing Provider  benzonatate (TESSALON) 100 MG capsule Take 1 capsule (100 mg total) by mouth every 8 (eight) hours. 03/26/18   Maxwell Caul, PA-C  cyclobenzaprine (FLEXERIL) 10 MG tablet Take 1 tablet (10 mg total) by mouth 2 (two) times daily as needed for muscle spasms. 04/21/18   Jacalyn Lefevre, MD  fluticasone (FLONASE) 50 MCG/ACT nasal spray Place 1  spray into both nostrils daily. 03/26/18   Maxwell Caul, PA-C  ibuprofen (ADVIL,MOTRIN) 600 MG tablet Take 1 tablet (600 mg total) by mouth every 6 (six) hours as needed. 04/21/18   Jacalyn Lefevre, MD    Family History Family History  Problem Relation Age of Onset  . Parkinson's disease Maternal Grandmother   . Dementia Maternal Grandmother     Social History Social History   Tobacco Use  . Smoking status: Current Every Day Smoker  . Smokeless tobacco: Never Used  Substance Use Topics  . Alcohol use: Yes    Comment: rare  . Drug use: No     Allergies   Patient has no known allergies.   Review of Systems Review of Systems  Musculoskeletal: Positive for back pain.  All other systems reviewed and are negative.    Physical Exam Updated Vital Signs BP 120/61 (BP Location: Right Arm)   Pulse 64   Temp 98.4 F (36.9 C) (Oral)   Resp 16   Ht 5\' 3"  (1.6 m)   Wt 84.4 kg   SpO2 100%   BMI 32.95 kg/m   Physical Exam  Constitutional: She is oriented to person,  place, and time. She appears well-developed and well-nourished.  HENT:  Head: Normocephalic and atraumatic.  Right Ear: External ear normal.  Left Ear: External ear normal.  Nose: Nose normal.  Mouth/Throat: Oropharynx is clear and moist.  Eyes: Pupils are equal, round, and reactive to light. Conjunctivae and EOM are normal.  Neck: Normal range of motion. Neck supple.  Cardiovascular: Normal rate, regular rhythm, normal heart sounds and intact distal pulses.  Pulmonary/Chest: Effort normal and breath sounds normal.  Abdominal: Soft. Bowel sounds are normal.  Musculoskeletal: Normal range of motion.       Arms: Neurological: She is alert and oriented to person, place, and time.  Skin: Skin is warm. Capillary refill takes less than 2 seconds.  Psychiatric: She has a normal mood and affect. Her behavior is normal. Judgment and thought content normal.  Nursing note and vitals reviewed.    ED Treatments  / Results  Labs (all labs ordered are listed, but only abnormal results are displayed) Labs Reviewed  URINALYSIS, ROUTINE W REFLEX MICROSCOPIC - Abnormal; Notable for the following components:      Result Value   Specific Gravity, Urine >1.030 (*)    All other components within normal limits  PREGNANCY, URINE    EKG None  Radiology No results found.  Procedures Procedures (including critical care time)  Medications Ordered in ED Medications  ketorolac (TORADOL) 30 MG/ML injection 30 mg (30 mg Intramuscular Given 04/21/18 2155)     Initial Impression / Assessment and Plan / ED Course  I have reviewed the triage vital signs and the nursing notes.  Pertinent labs & imaging results that were available during my care of the patient were reviewed by me and considered in my medical decision making (see chart for details).     Urine ok.  Pt given toradol here.  She will be d/c with flexeril and ibuprofen for pain.  She is given a note for work.  Return if worse and f/u with pcp.  Final Clinical Impressions(s) / ED Diagnoses   Final diagnoses:  Strain of lumbar region, initial encounter    ED Discharge Orders         Ordered    cyclobenzaprine (FLEXERIL) 10 MG tablet  2 times daily PRN     04/21/18 2156    ibuprofen (ADVIL,MOTRIN) 600 MG tablet  Every 6 hours PRN     04/21/18 2156           Jacalyn Lefevre, MD 04/21/18 2200

## 2019-05-05 ENCOUNTER — Emergency Department (HOSPITAL_BASED_OUTPATIENT_CLINIC_OR_DEPARTMENT_OTHER)
Admission: EM | Admit: 2019-05-05 | Discharge: 2019-05-05 | Disposition: A | Discharge status: Home / Self Care | Payer: Medicaid Other | Attending: Emergency Medicine | Admitting: Emergency Medicine

## 2019-05-05 ENCOUNTER — Encounter (HOSPITAL_BASED_OUTPATIENT_CLINIC_OR_DEPARTMENT_OTHER): Payer: Self-pay

## 2019-05-05 ENCOUNTER — Other Ambulatory Visit: Payer: Self-pay

## 2019-05-05 DIAGNOSIS — R1110 Vomiting, unspecified: Secondary | ICD-10-CM | POA: Diagnosis present

## 2019-05-05 DIAGNOSIS — Z79899 Other long term (current) drug therapy: Secondary | ICD-10-CM | POA: Diagnosis not present

## 2019-05-05 DIAGNOSIS — R197 Diarrhea, unspecified: Secondary | ICD-10-CM | POA: Insufficient documentation

## 2019-05-05 DIAGNOSIS — R112 Nausea with vomiting, unspecified: Secondary | ICD-10-CM | POA: Diagnosis not present

## 2019-05-05 DIAGNOSIS — E860 Dehydration: Secondary | ICD-10-CM

## 2019-05-05 LAB — URINALYSIS, ROUTINE W REFLEX MICROSCOPIC
Glucose, UA: NEGATIVE mg/dL
Hgb urine dipstick: NEGATIVE
Ketones, ur: NEGATIVE mg/dL
Nitrite: NEGATIVE
Protein, ur: NEGATIVE mg/dL
Specific Gravity, Urine: 1.025 (ref 1.005–1.030)
pH: 6 (ref 5.0–8.0)

## 2019-05-05 LAB — BASIC METABOLIC PANEL
Anion gap: 7 (ref 5–15)
BUN: 8 mg/dL (ref 6–20)
CO2: 19 mmol/L — ABNORMAL LOW (ref 22–32)
Calcium: 8.4 mg/dL — ABNORMAL LOW (ref 8.9–10.3)
Chloride: 108 mmol/L (ref 98–111)
Creatinine, Ser: 0.49 mg/dL (ref 0.44–1.00)
GFR calc Af Amer: >60 mL/min (ref >60)
GFR calc non Af Amer: >60 mL/min (ref >60)
Glucose, Bld: 93 mg/dL (ref 70–99)
Potassium: 3.1 mmol/L — ABNORMAL LOW (ref 3.5–5.1)
Sodium: 134 mmol/L — ABNORMAL LOW (ref 135–145)

## 2019-05-05 LAB — CBC WITH DIFFERENTIAL/PLATELET
Abs Immature Granulocytes: 0.04 10*3/uL (ref 0.00–0.07)
Basophils Absolute: 0 10*3/uL (ref 0.0–0.1)
Basophils Relative: 0 %
Eosinophils Absolute: 0.1 10*3/uL (ref 0.0–0.5)
Eosinophils Relative: 1 %
HCT: 39.4 % (ref 36.0–46.0)
Hemoglobin: 12.6 g/dL (ref 12.0–15.0)
Immature Granulocytes: 0 %
Lymphocytes Relative: 12 %
Lymphs Abs: 1.6 10*3/uL (ref 0.7–4.0)
MCH: 26.9 pg (ref 26.0–34.0)
MCHC: 32 g/dL (ref 30.0–36.0)
MCV: 84 fL (ref 80.0–100.0)
Monocytes Absolute: 0.7 10*3/uL (ref 0.1–1.0)
Monocytes Relative: 5 %
Neutro Abs: 11.1 10*3/uL — ABNORMAL HIGH (ref 1.7–7.7)
Neutrophils Relative %: 82 %
Platelets: 168 10*3/uL (ref 150–400)
RBC: 4.69 MIL/uL (ref 3.87–5.11)
RDW: 13.7 % (ref 11.5–15.5)
WBC: 13.5 10*3/uL — ABNORMAL HIGH (ref 4.0–10.5)
nRBC: 0 % (ref 0.0–0.2)

## 2019-05-05 LAB — URINALYSIS, MICROSCOPIC (REFLEX)

## 2019-05-05 LAB — PREGNANCY, URINE: Preg Test, Ur: NEGATIVE

## 2019-05-05 MED ORDER — ONDANSETRON HCL 4 MG/2ML IJ SOLN
4.0000 mg | Freq: Once | INTRAMUSCULAR | Status: AC
  Administered 2019-05-05: 12:00:00 4 mg via INTRAVENOUS
  Filled 2019-05-05: qty 2

## 2019-05-05 MED ORDER — SODIUM CHLORIDE 0.9 % IV BOLUS
1000.0000 mL | Freq: Once | INTRAVENOUS | Status: AC
  Administered 2019-05-05: 1000 mL via INTRAVENOUS

## 2019-05-05 MED ORDER — ONDANSETRON HCL 4 MG PO TABS: 4.0000 mg | ORAL_TABLET | Freq: Three times a day (TID) | ORAL | 0 refills | Status: DC | PRN

## 2019-05-05 MED ORDER — POTASSIUM CHLORIDE CRYS ER 20 MEQ PO TBCR
20.0000 meq | EXTENDED_RELEASE_TABLET | Freq: Once | ORAL | Status: AC
  Administered 2019-05-05: 13:00:00 20 meq via ORAL
  Filled 2019-05-05: qty 1

## 2019-05-05 MED FILL — ONDANSETRON HCL 4 MG TABLET: 4 | 5 days supply | Qty: 15 | Fill #0

## 2019-05-05 NOTE — ED Notes (Signed)
Water provided for po challenge 

## 2019-05-05 NOTE — Discharge Instructions (Signed)
You were evaluated in the emergency department for generalized weakness in the setting of nausea vomiting diarrhea.  You had lab work and were given some IV fluids and nausea medication with improvement in your symptoms.  Please continue to stay well-hydrated.  Nausea medication as needed for nausea.  Follow-up with your doctor or return to the emergency department if any worsening symptoms.

## 2019-05-05 NOTE — ED Provider Notes (Signed)
[facility] EMERGENCY DEPARTMENT Provider Note   CSN: [id number] Arrival date & time: 05/05/19  1104     History   Chief Complaint Chief Complaint  Patient presents with  . Vomiting    HPI Angela Lynch is a 28 y.o. female.  She is here with complaints of nausea along with multiple episodes of vomiting and diarrhea that started yesterday morning.  No abdominal pain.  She feels dizzy lightheaded and thinks she is dehydrated.  No fevers.  No urinary symptoms.  No cough or chest pain.  She is tried to drink some fluids but has not had much of an appetite.  She feels generally weak.     The history is provided by the patient.  GI Problem This is a new problem. The current episode started yesterday. The problem occurs constantly. The problem has not changed since onset.Pertinent negatives include no chest pain, no abdominal pain, no headaches and no shortness of breath. Nothing aggravates the symptoms. Nothing relieves the symptoms. She has tried rest and water for the symptoms. The treatment provided no relief.    Past Medical History:  Diagnosis Date  . GERD (gastroesophageal reflux disease)   . Medical history non-contributory   . Vaginal Pap smear, abnormal     Patient Active Problem List   Diagnosis Date Noted  . NSVD (normal spontaneous vaginal delivery) 08/17/2015  . Labor and delivery indication for care or intervention 08/15/2015  . Vaginal delivery 04/22/2014  . Premature rupture of membranes (PROM) at term with onset of labor after 24 hours, antepartum 04/19/2014  . Supervision of normal first pregnancy 12/21/2013  . Insufficient prenatal care 12/21/2013  . Hyperlipidemia 12/10/2013  . GERD (gastroesophageal reflux disease) 12/10/2013  . Obesity 12/10/2013    Past Surgical History:  Procedure Laterality Date  . NO PAST SURGERIES       OB History    Gravida  3   Para  2   Term  2   Preterm  0   AB  0   Living  2     SAB  0   TAB   0   Ectopic  0   Multiple  0   Live Births  2            Home Medications    Prior to Admission medications   Medication Sig Start Date End Date Taking? Authorizing Provider  benzonatate (TESSALON) 100 MG capsule Take 1 capsule (100 mg total) by mouth every 8 (eight) hours. 03/26/18   Maxwell CaulLayden, Lindsey A, PA-C  cyclobenzaprine (FLEXERIL) 10 MG tablet Take 1 tablet (10 mg total) by mouth 2 (two) times daily as needed for muscle spasms. 04/21/18   Jacalyn LefevreHaviland, Julie, MD  fluticasone (FLONASE) 50 MCG/ACT nasal spray Place 1 spray into both nostrils daily. 03/26/18   Maxwell CaulLayden, Lindsey A, PA-C  ibuprofen (ADVIL,MOTRIN) 600 MG tablet Take 1 tablet (600 mg total) by mouth every 6 (six) hours as needed. 04/21/18   Jacalyn LefevreHaviland, Julie, MD    Family History Family History  Problem Relation Age of Onset  . Parkinson's disease Maternal Grandmother   . Dementia Maternal Grandmother     Social History Social History   Tobacco Use  . Smoking status: Current Every Day Smoker  . Smokeless tobacco: Never Used  Substance Use Topics  . Alcohol use: Yes    Comment: rare  . Drug use: No     Allergies   Patient has no known allergies.   Review  of Systems Review of Systems  Constitutional: Negative for fever.  HENT: Negative for sore throat.   Eyes: Negative for visual disturbance.  Respiratory: Negative for cough and shortness of breath.   Cardiovascular: Negative for chest pain.  Gastrointestinal: Positive for diarrhea, nausea and vomiting. Negative for abdominal pain.  Genitourinary: Negative for dysuria.  Musculoskeletal: Negative for neck pain.  Skin: Negative for rash.  Neurological: Positive for dizziness and light-headedness. Negative for headaches.     Physical Exam Updated Vital Signs BP 133/72 (BP Location: Right Arm)   Pulse 84   Temp 98.1 F (36.7 C) (Oral)   Resp 18   Ht 5\' 3"  (1.6 m)   Wt 88.5 kg   SpO2 100%   BMI 34.54 kg/m   Physical Exam Vitals signs and  nursing note reviewed.  Constitutional:      General: She is not in acute distress.    Appearance: She is well-developed.  HENT:     Head: Normocephalic and atraumatic.  Eyes:     Conjunctiva/sclera: Conjunctivae normal.  Neck:     Musculoskeletal: Neck supple.  Cardiovascular:     Rate and Rhythm: Normal rate and regular rhythm.     Heart sounds: No murmur.  Pulmonary:     Effort: Pulmonary effort is normal. No respiratory distress.     Breath sounds: Normal breath sounds.  Abdominal:     Palpations: Abdomen is soft.     Tenderness: There is no abdominal tenderness. There is no guarding or rebound.  Musculoskeletal: Normal range of motion.     Right lower leg: No edema.     Left lower leg: No edema.  Skin:    General: Skin is warm and dry.     Capillary Refill: Capillary refill takes less than 2 seconds.  Neurological:     General: No focal deficit present.     Mental Status: She is alert and oriented to person, place, and time.     Gait: Gait normal.      ED Treatments / Results  Labs (all labs ordered are listed, but only abnormal results are displayed) Labs Reviewed  BASIC METABOLIC PANEL - Abnormal; Notable for the following components:      Result Value   Sodium 134 (*)    Potassium 3.1 (*)    CO2 19 (*)    Calcium 8.4 (*)    All other components within normal limits  CBC WITH DIFFERENTIAL/PLATELET - Abnormal; Notable for the following components:   WBC 13.5 (*)    Neutro Abs 11.1 (*)    All other components within normal limits  URINALYSIS, ROUTINE W REFLEX MICROSCOPIC - Abnormal; Notable for the following components:   APPearance CLOUDY (*)    Bilirubin Urine SMALL (*)    Leukocytes,Ua SMALL (*)    All other components within normal limits  URINALYSIS, MICROSCOPIC (REFLEX) - Abnormal; Notable for the following components:   Bacteria, UA FEW (*)    All other components within normal limits  PREGNANCY, URINE    EKG None  Radiology No results found.   Procedures Procedures (including critical care time)  Medications Ordered in ED Medications  sodium chloride 0.9 % bolus 1,000 mL ( Intravenous Stopped 05/05/19 1303)  ondansetron (ZOFRAN) injection 4 mg (4 mg Intravenous Given 05/05/19 1201)  potassium chloride SA (KLOR-CON) CR tablet 20 mEq (20 mEq Oral Given 05/05/19 1309)     Initial Impression / Assessment and Plan / ED Course  I have reviewed the triage  vital signs and the nursing notes.  Pertinent labs & imaging results that were available during my care of the patient were reviewed by me and considered in my medical decision making (see chart for details).  Clinical Course as of May 04 1902  Tue May 05, 2019  1127 Patient presenting with over 24 hours of nausea vomiting diarrhea multiple episodes.  Nonbloody nonbilious.  No fever.  No abdominal pain.  Differential includes gastroenteritis, food poisoning, dehydration, metabolic derangement.  Check some screening labs pregnancy test and give IV fluids Zofran.   [MB]  1346 Reevaluated patient.  She says she feels improved after some IV fluids.  Lab work significant for a low potassium of 3.1.  Slightly elevated white count of 13.5.  Urine concentrated 6-10 white cells but 11-20 squames so likely more contaminated than anything.  Will discharge with a prescription for some Zofran.   [MB]    Clinical Course User Index [MB] Terrilee Files, MD       Final Clinical Impressions(s) / ED Diagnoses   Final diagnoses:  Nausea vomiting and diarrhea  Dehydration    ED Discharge Orders         Ordered    ondansetron (ZOFRAN) 4 MG tablet  Every 8 hours PRN     05/05/19 1349           Terrilee Files, MD 05/05/19 1904

## 2019-05-05 NOTE — ED Triage Notes (Signed)
Pt c/o n/v/d started yesterday-NAD-steady gait 

## 2021-02-15 ENCOUNTER — Other Ambulatory Visit: Payer: Self-pay

## 2021-02-15 ENCOUNTER — Ambulatory Visit (INDEPENDENT_AMBULATORY_CARE_PROVIDER_SITE_OTHER): Payer: Medicaid Other | Admitting: Podiatry

## 2021-02-15 ENCOUNTER — Ambulatory Visit (INDEPENDENT_AMBULATORY_CARE_PROVIDER_SITE_OTHER): Payer: Medicaid Other

## 2021-02-15 ENCOUNTER — Other Ambulatory Visit (HOSPITAL_BASED_OUTPATIENT_CLINIC_OR_DEPARTMENT_OTHER): Payer: Self-pay

## 2021-02-15 ENCOUNTER — Encounter: Payer: Self-pay | Admitting: Podiatry

## 2021-02-15 DIAGNOSIS — M722 Plantar fascial fibromatosis: Secondary | ICD-10-CM

## 2021-02-15 MED ORDER — DICLOFENAC SODIUM 75 MG PO TBEC
75.0000 mg | DELAYED_RELEASE_TABLET | Freq: Two times a day (BID) | ORAL | 2 refills | Status: DC
  Filled 2021-02-15: qty 50, 25d supply, fill #0

## 2021-02-15 MED ORDER — DICLOFENAC SODIUM 75 MG PO TBEC: 75.0000 mg | DELAYED_RELEASE_TABLET | Freq: Two times a day (BID) | ORAL | 2 refills | Status: DC

## 2021-02-15 MED ORDER — TRIAMCINOLONE ACETONIDE 10 MG/ML IJ SUSP
20.0000 mg | Freq: Once | INTRAMUSCULAR | Status: AC
  Administered 2021-02-15: 20 mg

## 2021-02-15 NOTE — Progress Notes (Signed)
Subjective:   Patient ID: Angela Lynch, female   DOB: 30 y.o.   MRN: [id number]   HPI Patient presents stating she has had a lot of pain in the heels of both feet states its been associated also with tingling and numbness and states it is worse after periods of sitting and when getting up in the morning.  Patient does not smoke currently and likes to be active   Review of Systems  All other systems reviewed and are negative.      Objective:  Physical Exam Vitals and nursing note reviewed.  Constitutional:      Appearance: She is well-developed.  Pulmonary:     Effort: Pulmonary effort is normal.  Musculoskeletal:        General: Normal range of motion.  Skin:    General: Skin is warm.  Neurological:     Mental Status: She is alert.    Neurovascular status intact muscle strength adequate range of motion adequate patient found to have exquisite discomfort in the plantar aspect of the heel region bilateral with inflammation fluid and flatfoot deformity bilateral.  Patient has good digital perfusion bilateral equinus condition noted bilateral     Assessment:  To plantar fasciitis bilateral with inflammation with flatfoot deformity     Plan:  H&P reviewed x-rays and discussed supportive therapy and went ahead did sterile prep and injected the fascia bilateral 3 mg Kenalog 5 mg Xylocaine and discussed

## 2021-02-15 NOTE — Patient Instructions (Signed)

## 2021-08-10 ENCOUNTER — Ambulatory Visit (INDEPENDENT_AMBULATORY_CARE_PROVIDER_SITE_OTHER): Payer: Medicaid Other | Admitting: Podiatry

## 2021-08-10 ENCOUNTER — Other Ambulatory Visit: Payer: Self-pay

## 2021-08-10 ENCOUNTER — Encounter: Payer: Self-pay | Admitting: Podiatry

## 2021-08-10 DIAGNOSIS — M722 Plantar fascial fibromatosis: Secondary | ICD-10-CM

## 2021-08-10 MED ORDER — TRIAMCINOLONE ACETONIDE 10 MG/ML IJ SUSP
20.0000 mg | Freq: Once | INTRAMUSCULAR | Status: AC
  Administered 2021-08-10: 20 mg

## 2021-08-11 NOTE — Progress Notes (Signed)
Subjective:   Patient ID: Angela Lynch, female   DOB: 31 y.o.   MRN: [id number]   HPI Patient presents stating her ankle did well for a number of months and has come back just over the last 6 to 8 weeks and she has been on her foot more   ROS      Objective:  Physical Exam  Neurovascular status intact with inflammation pain of the plantar fascia bilateral with fluid buildup     Assessment:  Plantar fasciitis bilateral that has reoccurred     Plan:  Micah Flesher ahead today did sterile prep and injected the plantar fascial bilateral 3 mg Kenalog 5 mg Xylocaine dispensed night splints bilateral that she will not actually get from hospital we will get from [facility] with giving her instructions today and reappoint as needed

## 2021-12-28 ENCOUNTER — Ambulatory Visit: Payer: Medicaid Other | Admitting: Podiatry

## 2022-01-11 ENCOUNTER — Ambulatory Visit: Payer: Medicaid Other | Admitting: Nurse Practitioner

## 2022-01-11 ENCOUNTER — Ambulatory Visit: Payer: Medicaid Other | Admitting: Podiatry

## 2022-01-22 ENCOUNTER — Ambulatory Visit: Payer: Medicaid Other | Admitting: Podiatry

## 2022-02-21 ENCOUNTER — Other Ambulatory Visit: Payer: Self-pay | Admitting: Podiatry

## 2022-02-27 ENCOUNTER — Telehealth: Payer: Self-pay

## 2022-02-27 NOTE — Telephone Encounter (Signed)
diclofenac (VOLTAREN) 75 MG EC tablet  Prior authorization was started on 02/20/22  Approved on August 30 This drug has been approved. Approved quantity: 50 Tablet per 30 day(s). You may fill up to a 34 day supply at a retail pharmacy. You may fill up to a 90 day supply for maintenance drugs

## 2022-04-12 ENCOUNTER — Ambulatory Visit (INDEPENDENT_AMBULATORY_CARE_PROVIDER_SITE_OTHER): Payer: Medicaid Other | Admitting: Podiatry

## 2022-04-12 ENCOUNTER — Encounter: Payer: Self-pay | Admitting: Podiatry

## 2022-04-12 DIAGNOSIS — M722 Plantar fascial fibromatosis: Secondary | ICD-10-CM

## 2022-04-12 MED ORDER — TRIAMCINOLONE ACETONIDE 10 MG/ML IJ SUSP
20.0000 mg | Freq: Once | INTRAMUSCULAR | Status: AC
  Administered 2022-04-12: 20 mg

## 2022-04-12 NOTE — Progress Notes (Signed)
Subjective:   Patient ID: Angela Lynch, female   DOB: 31 y.o.   MRN: [id number]   HPI Patient states she has had a reoccurrence of heel pain stated it did better for a number of months and is just started to become quite sore for her controlled by reduced activity   ROS      Objective:  Physical Exam  Neurovascular status intact with inflammation still in the heel region bilateral not severe but present with patient who has reduced her activity levels     Assessment:  Acute fasciitis bilateral     Plan:  Reviewed and I did went ahead at this point and I did reinject 3 mg Kenalog 5 ms right bilateral after reviewing risk and we did discuss surgery but since she got 6 months of relief I would rather continue to try to avoid that as I am concerned about the recovery.  Patient agrees with me completely will be seen back as needed

## 2022-05-06 ENCOUNTER — Other Ambulatory Visit: Payer: Self-pay | Admitting: Podiatry

## 2022-05-09 NOTE — Telephone Encounter (Signed)
Patient called to get refill of pain medication -  diclofenac (VOLTAREN) 75 MG EC tablet

## 2022-05-16 NOTE — Telephone Encounter (Signed)
She should make appointment for December. If she is taking this long term she should get from her family doctor so it can be monitored

## 2022-08-16 ENCOUNTER — Ambulatory Visit (INDEPENDENT_AMBULATORY_CARE_PROVIDER_SITE_OTHER): Payer: Medicaid Other | Admitting: Podiatry

## 2022-08-16 ENCOUNTER — Encounter: Payer: Self-pay | Admitting: Podiatry

## 2022-08-16 DIAGNOSIS — M722 Plantar fascial fibromatosis: Secondary | ICD-10-CM

## 2022-08-16 NOTE — Progress Notes (Signed)
Subjective:   Patient ID: Angela Lynch, female   DOB: 32 y.o.   MRN: [id number]   HPI Patient states this lesion on the outside of my right foot is killing me and it has been going on for around a year.  I was so focused on the heels I did not think about it but now that the heels feel better it is awful from the and I need to have something done.  Patient is not currently smoking and tries to be active   ROS      Objective:  Physical Exam  Neurovascular status intact good digital perfusion severe hyperkeratotic lesion directly on the fifth metatarsal head right with plantarflexion of the bone pain both plantar and lateral with heel pain that has reduced     Assessment:  Significant chronic tailor's bunion deformity right with lesion that is been very painful     Plan:  Reviewed vision at great length I do think given the prominence of the metatarsal head the fifth metatarsal head resection would be best and I did explain this versus osteotomy and she is opted for this.  I allowed her to read consent form and went over alternative treatments complications and that there is absolutely no long-term guarantees this will solve the problem.  Patient is scheduled for outpatient surgery all questions were answered I will also excise the lesion she understands total recovery will take 4 to 6 months and all questions answered today  X-rays reveal the lesion to be directly on the fifth metatarsal head right with marker

## 2022-08-20 ENCOUNTER — Telehealth: Payer: Self-pay | Admitting: Urology

## 2022-08-20 NOTE — Telephone Encounter (Signed)
DOS - 09/04/22  METATARSAL HEAD RESECTION 5TH RIGHT --- [id number] EXC BENIGN LESION RIGHT --- [id number]  [facility] MEDICAID   RECEIVED FAX FROM [facility] MEDICAID STATING THAT FOR CPT CODES 41660 AND [id number] NO PRIOR AUTH IS REQUIRED.

## 2022-09-03 MED ORDER — HYDROCODONE-ACETAMINOPHEN 10-325 MG PO TABS: 1.0000 | ORAL_TABLET | Freq: Three times a day (TID) | ORAL | 0 refills | Status: AC | PRN

## 2022-09-03 NOTE — Addendum Note (Signed)
Addended by: Wallene Huh on: 09/03/2022 05:20 PM   Modules accepted: Orders

## 2022-09-04 DIAGNOSIS — D492 Neoplasm of unspecified behavior of bone, soft tissue, and skin: Secondary | ICD-10-CM | POA: Diagnosis not present

## 2022-09-04 DIAGNOSIS — M2011 Hallux valgus (acquired), right foot: Secondary | ICD-10-CM | POA: Diagnosis not present

## 2022-09-10 ENCOUNTER — Ambulatory Visit: Payer: Medicaid Other

## 2022-09-10 ENCOUNTER — Ambulatory Visit (INDEPENDENT_AMBULATORY_CARE_PROVIDER_SITE_OTHER): Payer: Medicaid Other

## 2022-09-10 VITALS — BP 126/67 | HR 77

## 2022-09-10 DIAGNOSIS — Z9889 Other specified postprocedural states: Secondary | ICD-10-CM

## 2022-09-10 NOTE — Progress Notes (Signed)
Patient presents today for post op visit # 1 , patient of Dr. Paulla Dolly   POV # 1 DOS 09/04/22 --- 5TH METATARSAL HEAD RESECTION RIGHT, REMOVAL BENIGN NEOPLASM RIGHT WIDE EXCISION    Patient presents in walking boot. Denies any falls or injury to the foot. Foot is slightly swollen. No signs of infection. No calf pain or shortness of breath. Bandages dry and intact. Incision is intact.  Advised patient she can get the surgery site wet at this time but do not scrub or soak the foot. Patient verbalized understanding.   BP: 126/67 P: 77     Xrays taken today and reviewed by Dr. Paulla Dolly. He did take a look at the foot today as well.   Foot redressed today and placed back in the boot. Reviewed icing and elevation. Patient will follow up with Dr. Paulla Dolly for POV# 2 suture removal.

## 2022-09-24 ENCOUNTER — Encounter: Payer: Medicaid Other | Admitting: *Deleted

## 2022-09-24 NOTE — Progress Notes (Signed)
NO SHOW   This encounter was created in error - please disregard. 

## 2022-09-27 ENCOUNTER — Ambulatory Visit: Payer: Medicaid Other

## 2022-10-01 ENCOUNTER — Ambulatory Visit: Payer: Medicaid Other

## 2022-10-01 ENCOUNTER — Ambulatory Visit (INDEPENDENT_AMBULATORY_CARE_PROVIDER_SITE_OTHER): Payer: Medicaid Other | Admitting: Podiatry

## 2022-10-01 ENCOUNTER — Encounter: Payer: Self-pay | Admitting: Podiatry

## 2022-10-01 VITALS — Ht 63.0 in | Wt 195.0 lb

## 2022-10-01 DIAGNOSIS — Z9889 Other specified postprocedural states: Secondary | ICD-10-CM

## 2022-10-02 NOTE — Progress Notes (Signed)
Subjective:   Patient ID: Angela Lynch, female   DOB: 32 y.o.   MRN: [id number]   HPI Patient states doing well with surgery still have some numbness and some discomfort   ROS      Objective:  Physical Exam  Neurovascular status intact negative Denna Haggard' sign noted wound edges well coapted stitches in place plantarly only mild discomfort noted localized numbness     Assessment:  Doing well overall postoperatively stitches intact with wound edges well coapted     Plan:  Stitches removed wound edges remain well coapted continue compression immobilization elevation and reappoint as needed should heal gradually but will probably take another 2 to 4 months for complete healing

## 2022-11-23 ENCOUNTER — Emergency Department (HOSPITAL_BASED_OUTPATIENT_CLINIC_OR_DEPARTMENT_OTHER)
Admission: EM | Admit: 2022-11-23 | Discharge: 2022-11-23 | Disposition: A | Discharge status: Home / Self Care | Payer: Medicaid Other | Attending: Emergency Medicine | Admitting: Emergency Medicine

## 2022-11-23 ENCOUNTER — Encounter (HOSPITAL_BASED_OUTPATIENT_CLINIC_OR_DEPARTMENT_OTHER): Payer: Self-pay | Admitting: Emergency Medicine

## 2022-11-23 DIAGNOSIS — J020 Streptococcal pharyngitis: Secondary | ICD-10-CM

## 2022-11-23 DIAGNOSIS — J029 Acute pharyngitis, unspecified: Secondary | ICD-10-CM | POA: Diagnosis present

## 2022-11-23 LAB — GROUP A STREP BY PCR: Group A Strep by PCR: DETECTED — AB

## 2022-11-23 MED ORDER — IBUPROFEN 400 MG PO TABS
600.0000 mg | ORAL_TABLET | Freq: Once | ORAL | Status: AC
  Administered 2022-11-23: 600 mg via ORAL
  Filled 2022-11-23: qty 1

## 2022-11-23 MED ORDER — PREDNISONE 20 MG PO TABS: 40.0000 mg | ORAL_TABLET | Freq: Every day | ORAL | 0 refills | Status: AC

## 2022-11-23 MED ORDER — PENICILLIN G BENZATHINE 1200000 UNIT/2ML IM SUSY
1.2000 10*6.[IU] | PREFILLED_SYRINGE | Freq: Once | INTRAMUSCULAR | Status: AC
  Administered 2022-11-23: 1.2 10*6.[IU] via INTRAMUSCULAR
  Filled 2022-11-23: qty 2

## 2022-11-23 MED ORDER — DEXAMETHASONE SODIUM PHOSPHATE 10 MG/ML IJ SOLN
10.0000 mg | Freq: Once | INTRAMUSCULAR | Status: AC
  Administered 2022-11-23: 10 mg via INTRAMUSCULAR
  Filled 2022-11-23: qty 1

## 2022-11-23 NOTE — Discharge Instructions (Addendum)
It was a pleasure taking care of you today!  Your strep test was positive in the ED.  You were treated with a one-time dose of penicillin. You will be sent a prescription for prednisone, take as directed. You may take over-the-counter 600 mg ibuprofen every 6 hours and alternate with 500 mg Tylenol every 6 hours as needed for pain for no more than 7 days.  Ensure to maintain fluid intake with tea, broth, soup, Gatorade, Pedialyte, water.  You may follow-up with your primary care provider as needed.  Return to the emergency department if experiencing increasing/worsening trouble swallowing, trouble breathing, fever, worsening symptoms.

## 2022-11-23 NOTE — ED Provider Notes (Signed)
[place] EMERGENCY DEPARTMENT AT [facility] Provider Note   CSN: [id number] Arrival date & time: 11/23/22  1155     History  No chief complaint on file.   Angela Lynch is a 32 y.o. female who presents to the ED with concerns for sore throat x 3 days. Sick contacts at home with similar symptoms. No meds tried at home. Has rhinorrhea, nasal congestion, painful swallowing. Denies trouble swallowing, fever, shortness of breath.   The history is provided by the patient. No language interpreter was used.       Home Medications Prior to Admission medications   Not on File      Allergies    Patient has no known allergies.    Review of Systems   Review of Systems  All other systems reviewed and are negative.   Physical Exam Updated Vital Signs BP 131/68 (BP Location: Right Arm)   Pulse 79   Temp 98.9 F (37.2 C) (Oral)   Resp 16   SpO2 99%  Physical Exam Vitals and nursing note reviewed.  Constitutional:      General: She is not in acute distress.    Appearance: Normal appearance.  HENT:     Mouth/Throat:     Mouth: Mucous membranes are moist.     Pharynx: Oropharynx is clear. Uvula midline. Posterior oropharyngeal erythema present. No uvula swelling.     Tonsils: No tonsillar exudate.     Comments: Uvula midline without swelling. Posterior pharyngeal erythema without appreciable tonsillar exudate noted. Patent airway. Pt able to speak in clear complete sentences. Tolerating oral secretions. Eyes:     General: No scleral icterus.    Extraocular Movements: Extraocular movements intact.  Cardiovascular:     Rate and Rhythm: Normal rate.  Pulmonary:     Effort: Pulmonary effort is normal. No respiratory distress.  Abdominal:     Palpations: Abdomen is soft. There is no mass.     Tenderness: There is no abdominal tenderness.  Musculoskeletal:        General: Normal range of motion.     Cervical back: Neck supple.  Skin:    General: Skin is warm  and dry.     Findings: No rash.  Neurological:     Mental Status: She is alert.     Sensory: Sensation is intact.     Motor: Motor function is intact.  Psychiatric:        Behavior: Behavior normal.     ED Results / Procedures / Treatments   Labs (all labs ordered are listed, but only abnormal results are displayed) Labs Reviewed  GROUP A STREP BY PCR    EKG None  Radiology No results found.  Procedures Procedures    Medications Ordered in ED Medications  dexamethasone (DECADRON) injection 10 mg (has no administration in time range)  ibuprofen (ADVIL) tablet 600 mg (has no administration in time range)    ED Course/ Medical Decision Making/ A&P Clinical Course as of 11/23/22 1353  Fri Nov 23, 2022  1254 Group A Strep by PCR(!): DETECTED [SB]  1300 Pt re-evaluated and noted improvement of her symptoms with treatment regimen in the ED. Discussed with patient that we can treat with one time PCN IM in ED to which patient is agreeable at this time. Discussed discharge treatment plan. Answered all available questions. Pt appears safe for discharge at this time.  [SB]    Clinical Course User Index [SB] Benjaman Artman A, PA-C  Medical Decision Making Amount and/or Complexity of Data Reviewed Labs:  Decision-making details documented in ED Course.  Risk Prescription drug management.   Patient with sore throat onset 3 days.  Sick contacts at home. On exam patient with Uvula midline without swelling. Posterior pharyngeal erythema without appreciable tonsillar exudate noted. Patent airway. Pt able to speak in clear complete sentences. Tolerating oral secretions. Differential diagnosis includes strep pharyngitis, peritonsillar abscess, strep pharyngitis, or Ludwig's angina.  Labs:  I ordered, and personally interpreted labs.  The pertinent results include:   Strep swab positive.  Medications:  I ordered medication including penicillin, decadron  and ibuprofen for strep treatment and symptom management Reevaluation of the patient after these medicines and interventions, I reevaluated the patient and found that they have improved I have reviewed the patients home medicines and have made adjustments as needed Pt tolerating PO intake in the ED without difficulty.    Disposition: Pt presentation suspicious for strep pharyngitis. Doubt COVID or flu at this time. Patent airway, tolerating secretions, no concern for airway compromise. Less likely Ludwigs angina, no trismus or edema to floor of mouth on exam. Less likely peritonsillar abscess, no fluctuant abscess noted on exam, patent airway, oxygen saturation 100%, water and tolerating secretions.  Patient treated with a one-time penicillin dose IM in the ED. After consideration of the diagnostic results and the patients response to treatment, I feel that the patient would benefit from Discharge home. Prednisone Rx sent in today. Strict return precautions discussed including inability to tolerate secretions, fever, inability to open mouth.  Patient acknowledges and verbalizes understanding.  Recommended primary care follow-up.  Patient appears safe for discharge at this time.  Follow-up as indicated in discharge paperwork.  This chart was dictated using voice recognition software, Dragon. Despite the best efforts of this provider to proofread and correct errors, errors may still occur which can change documentation meaning.   Final Clinical Impression(s) / ED Diagnoses Final diagnoses:  Strep pharyngitis    Rx / DC Orders ED Discharge Orders          Ordered    predniSONE (DELTASONE) 20 MG tablet  Daily        11/23/22 1320              Yamen Castrogiovanni A, PA-C 11/23/22 1353    Gwyneth Sprout, MD 11/24/22 2049

## 2022-11-23 NOTE — ED Notes (Signed)
Pt discharged to home using teachback Method. Discharge instructions have been discussed with patient and/or family members. Pt verbally acknowledges understanding d/c instructions, has been given opportunity for questions to be answered, and endorses comprehension to checkout at registration before leaving.  

## 2022-11-23 NOTE — ED Triage Notes (Signed)
Pt here from home with c/o sore throat times 3 days , back of the throat is red and tonsils are swollen

## 2023-01-23 ENCOUNTER — Ambulatory Visit: Payer: Medicaid Other | Admitting: Podiatry

## 2023-02-12 ENCOUNTER — Encounter (HOSPITAL_BASED_OUTPATIENT_CLINIC_OR_DEPARTMENT_OTHER): Payer: Self-pay | Admitting: Emergency Medicine

## 2023-02-12 ENCOUNTER — Emergency Department (HOSPITAL_BASED_OUTPATIENT_CLINIC_OR_DEPARTMENT_OTHER)
Admission: EM | Admit: 2023-02-12 | Discharge: 2023-02-12 | Disposition: A | Discharge status: Home / Self Care | Payer: Medicaid Other | Attending: Emergency Medicine | Admitting: Emergency Medicine

## 2023-02-12 ENCOUNTER — Emergency Department (HOSPITAL_BASED_OUTPATIENT_CLINIC_OR_DEPARTMENT_OTHER): Payer: Medicaid Other | Admitting: Radiology

## 2023-02-12 DIAGNOSIS — M25512 Pain in left shoulder: Secondary | ICD-10-CM | POA: Insufficient documentation

## 2023-02-12 LAB — PREGNANCY, URINE: Preg Test, Ur: NEGATIVE

## 2023-02-12 MED ORDER — KETOROLAC TROMETHAMINE 15 MG/ML IJ SOLN
15.0000 mg | Freq: Once | INTRAMUSCULAR | Status: AC
  Administered 2023-02-12: 15 mg via INTRAMUSCULAR
  Filled 2023-02-12: qty 1

## 2023-02-12 MED ORDER — NAPROXEN 500 MG PO TABS: 500.0000 mg | ORAL_TABLET | Freq: Two times a day (BID) | ORAL | 0 refills | Status: DC

## 2023-02-12 NOTE — Discharge Instructions (Addendum)
It was a pleasure taking care of you today.  As discussed, your x-ray was normal.  I am sending you home with pain medication.  Take as needed for pain.  I have included the number of the orthopedic surgeon.  Please call to schedule an appointment for further evaluation.  Return to the ER for any worsening symptoms.

## 2023-02-12 NOTE — ED Provider Notes (Signed)
[place] EMERGENCY DEPARTMENT AT [facility] Provider Note   CSN: [id number] Arrival date & time: 02/12/23  1257     History  Chief Complaint  Patient presents with   Shoulder Pain    BRIGGETTE Lynch is a 32 y.o. female with no significant past medical history who presents to the ED due to persistent left shoulder pain x 3 months.  Patient states pain started when reaching backwards to either scratch her back or take her bra off.  Admits to significant pain with overhead motion and reaching backwards.  No direct injury to left shoulder.  No chest pain or shortness of breath.  Patient feels like "something is moving" in her left shoulder.  Denies any dislocations.  No fever or chills.  No erythema, edema, or warmth.  History obtained from patient and past medical records. No interpreter used during encounter.       Home Medications Prior to Admission medications   Medication Sig Start Date End Date Taking? Authorizing Provider  naproxen (NAPROSYN) 500 MG tablet Take 1 tablet (500 mg total) by mouth 2 (two) times daily. 02/12/23  Yes Mannie Stabile, PA-C      Allergies    Patient has no known allergies.    Review of Systems   Review of Systems  Constitutional:  Negative for fever.  Respiratory:  Negative for shortness of breath.   Cardiovascular:  Negative for chest pain.  Musculoskeletal:  Positive for arthralgias.    Physical Exam Updated Vital Signs BP (!) 119/58 (BP Location: Right Arm)   Pulse 70   Temp 98.1 F (36.7 C) (Oral)   Resp 18   Wt 89.4 kg   SpO2 100%   BMI 34.90 kg/m  Physical Exam Vitals and nursing note reviewed.  Constitutional:      General: She is not in acute distress.    Appearance: She is not ill-appearing.  HENT:     Head: Normocephalic.  Eyes:     Pupils: Pupils are equal, round, and reactive to light.  Cardiovascular:     Rate and Rhythm: Normal rate and regular rhythm.     Pulses: Normal pulses.     Heart sounds:  Normal heart sounds. No murmur heard.    No friction rub. No gallop.  Pulmonary:     Effort: Pulmonary effort is normal.     Breath sounds: Normal breath sounds.  Abdominal:     General: Abdomen is flat. There is no distension.     Palpations: Abdomen is soft.     Tenderness: There is no abdominal tenderness. There is no guarding or rebound.  Musculoskeletal:        General: Normal range of motion.     Cervical back: Neck supple.     Comments: No tenderness to left shoulder.  No erythema, edema, or warmth.  Decreased range of motion with overhead motion.  Radial pulse intact.  Soft compartments.  Skin:    General: Skin is warm and dry.  Neurological:     General: No focal deficit present.     Mental Status: She is alert.  Psychiatric:        Mood and Affect: Mood normal.        Behavior: Behavior normal.     ED Results / Procedures / Treatments   Labs (all labs ordered are listed, but only abnormal results are displayed) Labs Reviewed  PREGNANCY, URINE    EKG None  Radiology DG Shoulder Left  Result Date:  02/12/2023 CLINICAL DATA:  pain EXAM: LEFT SHOULDER - 2+ VIEW COMPARISON:  None Available. FINDINGS: Normal alignment. No acute fracture. Normal mineralization. The soft tissues are unremarkable. IMPRESSION: No acute osseous abnormality. Electronically Signed   By: Olive Bass M.D.   On: 02/12/2023 16:18    Procedures Procedures    Medications Ordered in ED Medications  ketorolac (TORADOL) 15 MG/ML injection 15 mg (15 mg Intramuscular Given 02/12/23 1538)    ED Course/ Medical Decision Making/ A&P                                 Medical Decision Making Amount and/or Complexity of Data Reviewed Labs: ordered. Decision-making details documented in ED Course. Radiology: ordered and independent interpretation performed. Decision-making details documented in ED Course.  Risk Prescription drug management.   32 year old female presents to the ED due to left  shoulder pain x 3 months.  No direct injury.  Denies chest pain and shortness of breath. Feels like something is "moving" in left shoulder. No dislocations.  Upon arrival, stable vitals.  Patient in no acute distress.  No tenderness to left shoulder.  Slight decreased range of motion with overhead movement.  No erythema, edema, or warmth to suggest septic joint.  No cervical midline tenderness.  Equal grip strength.  Low suspicion for central cord compression.  X-ray personally reviewed and interpreted which is negative for any bony fractures or acute abnormalities.  Patient given Toradol with some improvement in pain.  Patient placed in sling.  Patient discharged with pain medication.  Orthopedics number given to patient discharge advised to call to schedule an appointment for further evaluation. No cardiac risk factors. Low suspicion for cardiac etiology of shoulder pain. Patient stable for discharge. Strict ED precautions discussed with patient. Patient states understanding and agrees to plan. Patient discharged home in no acute distress and stable vitals  Lives at home No PCP        Final Clinical Impression(s) / ED Diagnoses Final diagnoses:  Acute pain of left shoulder    Rx / DC Orders ED Discharge Orders          Ordered    naproxen (NAPROSYN) 500 MG tablet  2 times daily        02/12/23 1629              Jesusita Oka 02/12/23 1631    Laurence Spates, MD 02/13/23 [phone]

## 2023-02-12 NOTE — ED Triage Notes (Signed)
Pt arrives pov, steady gait, c/o LT shoulder pain and "feels like it comes out of place" x 3 months. Denies injury, denies CP

## 2023-03-13 ENCOUNTER — Encounter: Payer: Self-pay | Admitting: Podiatry

## 2023-03-13 ENCOUNTER — Ambulatory Visit (INDEPENDENT_AMBULATORY_CARE_PROVIDER_SITE_OTHER): Payer: Medicaid Other | Admitting: Podiatry

## 2023-03-13 DIAGNOSIS — D492 Neoplasm of unspecified behavior of bone, soft tissue, and skin: Secondary | ICD-10-CM | POA: Diagnosis not present

## 2023-03-13 DIAGNOSIS — M722 Plantar fascial fibromatosis: Secondary | ICD-10-CM | POA: Diagnosis not present

## 2023-03-13 MED ORDER — TRIAMCINOLONE ACETONIDE 10 MG/ML IJ SUSP
10.0000 mg | Freq: Once | INTRAMUSCULAR | Status: AC
Start: 2023-03-13 — End: 2023-03-13
  Administered 2023-03-13: 10 mg via INTRA_ARTICULAR

## 2023-03-13 NOTE — Progress Notes (Signed)
Subjective:   Patient ID: Angela Lynch, female   DOB: 32 y.o.   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  Reappoint to recheck as needed

## 2023-08-21 ENCOUNTER — Ambulatory Visit: Payer: Medicaid Other | Admitting: Podiatry

## 2023-10-07 ENCOUNTER — Ambulatory Visit (INDEPENDENT_AMBULATORY_CARE_PROVIDER_SITE_OTHER)

## 2023-10-07 ENCOUNTER — Encounter: Payer: Self-pay | Admitting: Podiatry

## 2023-10-07 ENCOUNTER — Ambulatory Visit (INDEPENDENT_AMBULATORY_CARE_PROVIDER_SITE_OTHER): Admitting: Podiatry

## 2023-10-07 DIAGNOSIS — M722 Plantar fascial fibromatosis: Secondary | ICD-10-CM | POA: Diagnosis not present

## 2023-10-07 MED ORDER — TRIAMCINOLONE ACETONIDE 10 MG/ML IJ SUSP
10.0000 mg | Freq: Once | INTRAMUSCULAR | Status: AC
Start: 2023-10-07 — End: 2023-10-07
  Administered 2023-10-07: 10 mg via INTRA_ARTICULAR

## 2023-10-07 NOTE — Progress Notes (Signed)
 Subjective:   Patient ID: Angela Lynch, female   DOB: 33 y.o.   MRN: [id number]   HPI Patient presents stating both of her heels have really been hurting her and she is getting pain in her forefoot that she is worried about bilaterally wants checked   ROS      Objective:  Physical Exam  Neuro vas scaler status intact significant pain of the plantar fascia bilateral at insertion with history of problem along with forefoot pain which I believe is more compensatory but cannot rule out pathology     Assessment:  Acute plantar fasciitis with possibility for forefoot capsulitis bilateral or arthritis     Plan:  H&P x-rays reviewed organ to focus on the heels and I did sterile prep injected the plantar fascia bilateral 3 mg Kenalog 5 mg Xylocaine applied sterile dressing and advised on more rigid bottom shoe support  X-rays indicate small plantar spur  Flatfoot deformity bilateral no indication stress fracture arthritis

## 2023-11-11 ENCOUNTER — Other Ambulatory Visit: Payer: Self-pay

## 2023-11-11 ENCOUNTER — Emergency Department (HOSPITAL_BASED_OUTPATIENT_CLINIC_OR_DEPARTMENT_OTHER)
Admission: EM | Admit: 2023-11-11 | Discharge: 2023-11-12 | Disposition: A | Discharge status: Home / Self Care | Attending: Emergency Medicine | Admitting: Emergency Medicine

## 2023-11-11 DIAGNOSIS — J020 Streptococcal pharyngitis: Secondary | ICD-10-CM | POA: Diagnosis not present

## 2023-11-11 DIAGNOSIS — J029 Acute pharyngitis, unspecified: Secondary | ICD-10-CM | POA: Diagnosis present

## 2023-11-11 LAB — RESP PANEL BY RT-PCR (RSV, FLU A&B, COVID)  RVPGX2
Influenza A by PCR: NEGATIVE
Influenza B by PCR: NEGATIVE
Resp Syncytial Virus by PCR: NEGATIVE
SARS Coronavirus 2 by RT PCR: NEGATIVE

## 2023-11-11 LAB — GROUP A STREP BY PCR: Group A Strep by PCR: DETECTED — AB

## 2023-11-11 MED ORDER — KETOROLAC TROMETHAMINE 10 MG PO TABS: 10.0000 mg | ORAL_TABLET | Freq: Four times a day (QID) | ORAL | 0 refills | Status: AC | PRN

## 2023-11-11 MED ORDER — AMOXICILLIN 500 MG PO CAPS
500.0000 mg | ORAL_CAPSULE | Freq: Once | ORAL | Status: AC
  Administered 2023-11-12: 500 mg via ORAL
  Filled 2023-11-11: qty 1

## 2023-11-11 MED ORDER — LIDOCAINE VISCOUS HCL 2 % MT SOLN
15.0000 mL | Freq: Once | OROMUCOSAL | Status: AC
  Administered 2023-11-12: 15 mL via OROMUCOSAL
  Filled 2023-11-11: qty 15

## 2023-11-11 MED ORDER — AMOXICILLIN 500 MG PO CAPS: 500.0000 mg | ORAL_CAPSULE | Freq: Two times a day (BID) | ORAL | 0 refills | Status: AC

## 2023-11-11 MED ORDER — KETOROLAC TROMETHAMINE 60 MG/2ML IM SOLN
30.0000 mg | Freq: Once | INTRAMUSCULAR | Status: AC
  Administered 2023-11-12: 30 mg via INTRAMUSCULAR
  Filled 2023-11-11: qty 2

## 2023-11-11 NOTE — ED Provider Notes (Signed)
 [place] EMERGENCY DEPARTMENT AT [facility] Provider Note   CSN: [id number] Arrival date & time: 11/11/23  2028     History  Chief Complaint  Patient presents with   Sore Throat    Angela Lynch is a 33 y.o. female.   Sore Throat     33 year old female with medical history significant for frequent strep throat infections presenting to the emergency department with a sore throat that started on Friday.  She endorses some nasal congestion, denies any chest pain, cough, shortness of breath.  No fevers or chills.  She is tolerating oral intake, no difficulty with range of motion of the neck, no unilateral nature to her neck soreness or swelling.  Home Medications Prior to Admission medications   Medication Sig Start Date End Date Taking? Authorizing Provider  amoxicillin  (AMOXIL ) 500 MG capsule Take 1 capsule (500 mg total) by mouth 2 (two) times daily for 10 days. 11/11/23 11/21/23 Yes Rosealee Concha, MD  ketorolac  (TORADOL ) 10 MG tablet Take 1 tablet (10 mg total) by mouth every 6 (six) hours as needed. 11/11/23  Yes Rosealee Concha, MD  Vitamin D, Ergocalciferol, (DRISDOL) 1.25 MG (50000 UNIT) CAPS capsule Take 50,000 Units by mouth once a week. 08/14/23   [provider]      Allergies    Patient has no known allergies.    Review of Systems   Review of Systems  All other systems reviewed and are negative.   Physical Exam Updated Vital Signs BP 128/77 (BP Location: Right Arm)   Pulse 74   Temp 98.6 F (37 C) (Oral)   Resp 18   SpO2 100%  Physical Exam Vitals and nursing note reviewed.  Constitutional:      General: She is not in acute distress. HENT:     Head: Normocephalic and atraumatic.     Mouth/Throat:     Pharynx: Posterior oropharyngeal erythema present.     Tonsils: Tonsillar exudate present. No tonsillar abscesses. 2+ on the right. 2+ on the left.  Eyes:     Conjunctiva/sclera: Conjunctivae normal.     Pupils: Pupils are equal,  round, and reactive to light.  Cardiovascular:     Rate and Rhythm: Normal rate and regular rhythm.  Pulmonary:     Effort: Pulmonary effort is normal. No respiratory distress.  Abdominal:     General: There is no distension.     Tenderness: There is no guarding.  Musculoskeletal:        General: No deformity or signs of injury.     Cervical back: Neck supple.  Skin:    Findings: No lesion or rash.  Neurological:     General: No focal deficit present.     Mental Status: She is alert. Mental status is at baseline.     ED Results / Procedures / Treatments   Labs (all labs ordered are listed, but only abnormal results are displayed) Labs Reviewed  GROUP A STREP BY PCR - Abnormal; Notable for the following components:      Result Value   Group A Strep by PCR DETECTED (*)    All other components within normal limits  RESP PANEL BY RT-PCR (RSV, FLU A&B, COVID)  [id number]    EKG None  Radiology No results found.  Procedures Procedures    Medications Ordered in ED Medications  lidocaine  (XYLOCAINE ) 2 % viscous mouth solution 15 mL (has no administration in time range)  ketorolac  (TORADOL ) injection 30 mg (has no administration in  time range)  amoxicillin  (AMOXIL ) capsule 500 mg (has no administration in time range)    ED Course/ Medical Decision Making/ A&P Clinical Course as of 11/11/23 2357  Mon Nov 11, 2023  2347 Group A Strep by PCR(!): DETECTED [JL]    Clinical Course User Index [JL] Rosealee Concha, MD                                 Medical Decision Making Amount and/or Complexity of Data Reviewed Labs:  Decision-making details documented in ED Course.  Risk Prescription drug management.    33 year old female with medical history significant for frequent strep throat infections presenting to the emergency department with a sore throat that started on Friday.  She endorses some nasal congestion, denies any chest pain, cough, shortness of breath.  No fevers  or chills.  She is tolerating oral intake, no difficulty with range of motion of the neck, no unilateral nature to her neck soreness or swelling.  On arrival the patient was vitally stable and afebrile, not tachycardic or tachypneic, hemodynamically stable.  Physical exam revealed bilateral oropharyngeal erythema with exudate.  Tonsils consistent with likely strep pharyngitis.  Strep PCR testing was collected and resulted positive.  COVID, flu, RSV PCR testing was collected and resulted negative.  Patient was administered oral lidocaine , IM Toradol  in addition to a single dose of amoxicillin .  Will discharge on amoxicillin  and given the patient's recurrent episodes of strep pharyngitis, will refer for outpatient follow-up with ENT for consideration for tonsillectomy.  Well-appearing, tolerating oral intake, stable for discharge.  Final Clinical Impression(s) / ED Diagnoses Final diagnoses:  Strep throat    Rx / DC Orders ED Discharge Orders          Ordered    Ambulatory referral to ENT        11/11/23 2356    amoxicillin  (AMOXIL ) 500 MG capsule  2 times daily        11/11/23 2356    ketorolac  (TORADOL ) 10 MG tablet  Every 6 hours PRN        11/11/23 2356              Rosealee Concha, MD 11/11/23 2357

## 2023-11-11 NOTE — ED Triage Notes (Signed)
 Friday sore throat started. Frequent strep. Nasal congestion.

## 2023-11-11 NOTE — Discharge Instructions (Addendum)
 Follow-up with your PCP to ensure resolution, referral has been placed for outpatient follow-up with ENT for consideration for tonsillectomy given your recurrent episodes of strep pharyngitis.  Take amoxicillin  for antibiosis and Toradol  has been prescribed for pain control.  Continue to push oral rehydration, follow-up for any unilateral neck swelling or significant pain or feeling like your throat is closing up.

## 2024-02-20 ENCOUNTER — Emergency Department (HOSPITAL_BASED_OUTPATIENT_CLINIC_OR_DEPARTMENT_OTHER)
Admission: EM | Admit: 2024-02-20 | Discharge: 2024-02-20 | Disposition: A | Discharge status: Home / Self Care | Attending: Emergency Medicine | Admitting: Emergency Medicine

## 2024-02-20 ENCOUNTER — Other Ambulatory Visit: Payer: Self-pay

## 2024-02-20 ENCOUNTER — Encounter (HOSPITAL_BASED_OUTPATIENT_CLINIC_OR_DEPARTMENT_OTHER): Payer: Self-pay

## 2024-02-20 DIAGNOSIS — K0889 Other specified disorders of teeth and supporting structures: Secondary | ICD-10-CM | POA: Diagnosis present

## 2024-02-20 MED ORDER — PENICILLIN V POTASSIUM 250 MG PO TABS
500.0000 mg | ORAL_TABLET | Freq: Once | ORAL | Status: AC
  Administered 2024-02-20: 500 mg via ORAL
  Filled 2024-02-20: qty 2

## 2024-02-20 MED ORDER — IBUPROFEN 600 MG PO TABS: 600.0000 mg | ORAL_TABLET | Freq: Four times a day (QID) | ORAL | 0 refills | Status: AC | PRN

## 2024-02-20 MED ORDER — KETOROLAC TROMETHAMINE 30 MG/ML IJ SOLN
30.0000 mg | Freq: Once | INTRAMUSCULAR | Status: AC
  Administered 2024-02-20: 30 mg via INTRAMUSCULAR
  Filled 2024-02-20: qty 1

## 2024-02-20 MED ORDER — PENICILLIN V POTASSIUM 500 MG PO TABS: 500.0000 mg | ORAL_TABLET | Freq: Four times a day (QID) | ORAL | 0 refills | Status: AC

## 2024-02-20 NOTE — Discharge Instructions (Signed)
 You were seen today for dental pain.  This is likely due to underlying infection and impaction of your wisdom tooth.  Take medications as prescribed.  Follow-up with dentistry as scheduled.

## 2024-02-20 NOTE — ED Provider Notes (Signed)
  EMERGENCY DEPARTMENT AT [facility] Provider Note   CSN: [id number] Arrival date & time: 02/20/24  9463     Patient presents with: Dental Pain   Angela Lynch is a 33 y.o. female.   HPI     This is a 33 year old female who presents with dental pain.  Patient reports she has had pain in the right lower jaw and into the right ear for several days.  She was seen by dentistry yesterday and is going to have teeth 30 and 31 pulled.  She has not had any fevers or difficulty swallowing.  She has taken a BC powder but has not taken scheduled anti-inflammatories as recommended previously.  She states she was unable to sleep secondary to the pain.  Prior to Admission medications   Medication Sig Start Date End Date Taking? Authorizing Provider  ibuprofen  (ADVIL ) 600 MG tablet Take 1 tablet (600 mg total) by mouth every 6 (six) hours as needed. 02/20/24  Yes Bladimir Auman, Charmaine FALCON, MD  penicillin  v potassium (VEETID) 500 MG tablet Take 1 tablet (500 mg total) by mouth 4 (four) times daily for 10 days. 02/20/24 03/01/24 Yes Caidence Higashi, Charmaine FALCON, MD  ketorolac  (TORADOL ) 10 MG tablet Take 1 tablet (10 mg total) by mouth every 6 (six) hours as needed. 11/11/23   Jerrol Agent, MD  Vitamin D, Ergocalciferol, (DRISDOL) 1.25 MG (50000 UNIT) CAPS capsule Take 50,000 Units by mouth once a week. 08/14/23   [provider]    Allergies: Patient has no known allergies.    Review of Systems  HENT:  Positive for dental problem.   All other systems reviewed and are negative.   Updated Vital Signs BP 122/64   Pulse 80   Temp 98.5 F (36.9 C)   Resp 18   SpO2 97%   Physical Exam Vitals and nursing note reviewed.  Constitutional:      Appearance: She is well-developed. She is not ill-appearing.  HENT:     Head: Normocephalic and atraumatic.     Mouth/Throat:     Mouth: Mucous membranes are moist.     Comments: Generally poor dentition, multiple dental caries noted on the  right with prior fillings noted, no palpable abscess along the gumline, no trismus, no fullness under the tongue Eyes:     Pupils: Pupils are equal, round, and reactive to light.  Cardiovascular:     Rate and Rhythm: Normal rate and regular rhythm.  Pulmonary:     Effort: Pulmonary effort is normal. No respiratory distress.  Abdominal:     Palpations: Abdomen is soft.  Musculoskeletal:     Cervical back: Neck supple.  Skin:    General: Skin is warm and dry.  Neurological:     Mental Status: She is alert and oriented to person, place, and time.  Psychiatric:        Mood and Affect: Mood normal.     (all labs ordered are listed, but only abnormal results are displayed) Labs Reviewed - No data to display  EKG: None  Radiology: No results found.   Procedures   Medications Ordered in the ED  penicillin  v potassium (VEETID) tablet 500 mg (500 mg Oral Given 02/20/24 0623)  ketorolac  (TORADOL ) 30 MG/ML injection 30 mg (30 mg Intramuscular Given 02/20/24 0623)  Medical Decision Making Risk Prescription drug management.   This patient presents to the ED for concern of dental pain, this involves an extensive number of treatment options, and is a complaint that carries with it a high risk of complications and morbidity.  I considered the following differential and admission for this acute, potentially life threatening condition.  The differential diagnosis includes abscess, underlying infection, impaction, fracture  MDM:    This is a 33 year old female who presents with dental pain.  She is nontoxic and vital signs are reassuring.  No signs or symptoms of deep space infection.  Suspect underlying infection.  She is scheduled to have her teeth removed on 9/11.  Discussed with her that she needs to be on scheduled anti-inflammatories.  Also recommend starting antibiotics for any potential underlying infection.  She is agreeable to plan.  (Labs,  imaging, consults)  Labs: I Ordered, and personally interpreted labs.  The pertinent results include: None  Imaging Studies ordered: I ordered imaging studies including none I independently visualized and interpreted imaging. I agree with the radiologist interpretation  Additional history obtained from chart review.  External records from outside source obtained and reviewed including prior evaluations  Cardiac Monitoring: The patient was not maintained on a cardiac monitor.  If on the cardiac monitor, I personally viewed and interpreted the cardiac monitored which showed an underlying rhythm of: N/A  Reevaluation: After the interventions noted above, I reevaluated the patient and found that they have :stayed the same  Social Determinants of Health:  lives independently  Disposition: Discharge  Co morbidities that complicate the patient evaluation  Past Medical History:  Diagnosis Date   GERD (gastroesophageal reflux disease)    Medical history non-contributory    Vaginal Pap smear, abnormal      Medicines Meds ordered this encounter  Medications   penicillin  v potassium (VEETID) tablet 500 mg   ketorolac  (TORADOL ) 30 MG/ML injection 30 mg   ibuprofen  (ADVIL ) 600 MG tablet    Sig: Take 1 tablet (600 mg total) by mouth every 6 (six) hours as needed.    Dispense:  30 tablet    Refill:  0   penicillin  v potassium (VEETID) 500 MG tablet    Sig: Take 1 tablet (500 mg total) by mouth 4 (four) times daily for 10 days.    Dispense:  40 tablet    Refill:  0    I have reviewed the patients home medicines and have made adjustments as needed  Problem List / ED Course: Problem List Items Addressed This Visit   None Visit Diagnoses       Pain, dental    -  Primary                Final diagnoses:  Pain, dental    ED Discharge Orders          Ordered    ibuprofen  (ADVIL ) 600 MG tablet  Every 6 hours PRN        02/20/24 0627    penicillin  v potassium (VEETID)  500 MG tablet  4 times daily        02/20/24 0627               Bari Charmaine FALCON, MD 02/20/24 [phone]

## 2024-02-20 NOTE — ED Triage Notes (Signed)
 Pt reports she is here today due to dental pain. Pt reports she is suppose to have her bottom right side (31,32) tooth pulled on the 9/11. Pt reports she is unable to deal with the pain. Pt reports OTC meds are not working for her.
